# Patient Record
Sex: Female | Born: 1970 | Race: White | Hispanic: No | Marital: Married | Smoking: Never smoker
Health system: Southern US, Community
[De-identification: ages and names within clinical notes are randomized; demographics above are authoritative.]

## PROBLEM LIST (undated history)

## (undated) DIAGNOSIS — F329 Major depressive disorder, single episode, unspecified: Secondary | ICD-10-CM

## (undated) DIAGNOSIS — M545 Low back pain, unspecified: Secondary | ICD-10-CM

## (undated) DIAGNOSIS — N200 Calculus of kidney: Secondary | ICD-10-CM

## (undated) DIAGNOSIS — Z9989 Dependence on other enabling machines and devices: Secondary | ICD-10-CM

## (undated) DIAGNOSIS — G4733 Obstructive sleep apnea (adult) (pediatric): Secondary | ICD-10-CM

## (undated) DIAGNOSIS — F32A Depression, unspecified: Secondary | ICD-10-CM

## (undated) DIAGNOSIS — G8929 Other chronic pain: Secondary | ICD-10-CM

## (undated) DIAGNOSIS — E7800 Pure hypercholesterolemia, unspecified: Secondary | ICD-10-CM

## (undated) HISTORY — PX: INTRAUTERINE DEVICE INSERTION: SHX323

---

## 2012-08-13 ENCOUNTER — Other Ambulatory Visit: Payer: Self-pay | Admitting: Neurological Surgery

## 2012-08-28 ENCOUNTER — Encounter (HOSPITAL_COMMUNITY): Payer: Self-pay | Admitting: Pharmacy Technician

## 2012-09-03 ENCOUNTER — Encounter (HOSPITAL_COMMUNITY)
Admission: RE | Admit: 2012-09-03 | Discharge: 2012-09-03 | Disposition: A | Discharge status: Home / Self Care | Payer: 59 | Source: Ambulatory Visit | Attending: Neurological Surgery | Admitting: Neurological Surgery

## 2012-09-03 ENCOUNTER — Encounter (HOSPITAL_COMMUNITY): Payer: Self-pay

## 2012-09-03 HISTORY — DX: Major depressive disorder, single episode, unspecified: F32.9

## 2012-09-03 HISTORY — DX: Depression, unspecified: F32.A

## 2012-09-03 LAB — CBC
HCT: 43.7 % (ref 36.0–46.0)
MCH: 30.1 pg (ref 26.0–34.0)
MCV: 82.6 fL (ref 78.0–100.0)
Platelets: 237 10*3/uL (ref 150–400)
RBC: 5.29 MIL/uL — ABNORMAL HIGH (ref 3.87–5.11)
RDW: 12.5 % (ref 11.5–15.5)

## 2012-09-03 LAB — TYPE AND SCREEN: Antibody Screen: NEGATIVE

## 2012-09-03 LAB — BASIC METABOLIC PANEL
BUN: 10 mg/dL (ref 6–23)
CO2: 24 mEq/L (ref 19–32)
Calcium: 10 mg/dL (ref 8.4–10.5)
Chloride: 103 mEq/L (ref 96–112)
Creatinine, Ser: 0.55 mg/dL (ref 0.50–1.10)

## 2012-09-03 LAB — SURGICAL PCR SCREEN: MRSA, PCR: NEGATIVE

## 2012-09-03 NOTE — Pre-Procedure Instructions (Addendum)
Vila Dory Mckesson  09/03/2012   Your procedure is scheduled on:  09/10/12  Report to [facility] at 530 AM.  Call this number if you have problems the morning of surgery: [phone]   Remember:   Do not eat food or drink liquids after midnight.   Take these medicines the morning of surgery with A SIP OF WATER:pain med,   STOP ibuprofen  09/05/12   Do not wear jewelry, make-up or nail polish.  Do not wear lotions, powders, or perfumes. You may wear deodorant.  Do not shave 48 hours prior to surgery. Men may shave face and neck.  Do not bring valuables to the hospital.  Contacts, dentures or bridgework may not be worn into surgery.  Leave suitcase in the car. After surgery it may be brought to your room.  For patients admitted to the hospital, checkout time is 11:00 AM the day of  discharge.   Patients discharged the day of surgery will not be allowed to drive  home.  Name and phone number of your driver:   Special Instructions: Shower using CHG 2 nights before surgery and the night before surgery.  If you shower the day of surgery use CHG.  Use special wash - you have one bottle of CHG for all showers.  You should use approximately 1/3 of the bottle for each shower.   Please read over the following fact sheets that you were given: Pain Booklet, Coughing and Deep Breathing, Blood Transfusion Information, MRSA Information and Surgical Site Infection Prevention

## 2012-09-09 MED ORDER — CEFAZOLIN SODIUM-DEXTROSE 2-3 GM-% IV SOLR
2.0000 g | INTRAVENOUS | Status: AC
  Administered 2012-09-10: 2 g via INTRAVENOUS
  Filled 2012-09-09 (×2): qty 50

## 2012-09-10 ENCOUNTER — Encounter (HOSPITAL_COMMUNITY): Payer: Self-pay | Admitting: *Deleted

## 2012-09-10 ENCOUNTER — Inpatient Hospital Stay (HOSPITAL_COMMUNITY)
Admission: RE | Admit: 2012-09-10 | Discharge: 2012-09-12 | DRG: 460 | Disposition: A | Discharge status: Home / Self Care | Payer: 59 | Source: Ambulatory Visit | Attending: Neurological Surgery | Admitting: Neurological Surgery

## 2012-09-10 ENCOUNTER — Inpatient Hospital Stay (HOSPITAL_COMMUNITY): Payer: 59

## 2012-09-10 ENCOUNTER — Encounter (HOSPITAL_COMMUNITY): Payer: Self-pay | Admitting: Vascular Surgery

## 2012-09-10 ENCOUNTER — Encounter (HOSPITAL_COMMUNITY)
Admission: RE | Disposition: A | Discharge status: Home / Self Care | Payer: Self-pay | Source: Ambulatory Visit | Attending: Neurological Surgery

## 2012-09-10 ENCOUNTER — Inpatient Hospital Stay (HOSPITAL_COMMUNITY): Payer: 59 | Admitting: Certified Registered"

## 2012-09-10 DIAGNOSIS — R0602 Shortness of breath: Secondary | ICD-10-CM | POA: Diagnosis present

## 2012-09-10 DIAGNOSIS — H9319 Tinnitus, unspecified ear: Secondary | ICD-10-CM | POA: Diagnosis present

## 2012-09-10 DIAGNOSIS — M47817 Spondylosis without myelopathy or radiculopathy, lumbosacral region: Principal | ICD-10-CM | POA: Diagnosis present

## 2012-09-10 DIAGNOSIS — M7989 Other specified soft tissue disorders: Secondary | ICD-10-CM | POA: Diagnosis present

## 2012-09-10 DIAGNOSIS — E7800 Pure hypercholesterolemia, unspecified: Secondary | ICD-10-CM | POA: Diagnosis present

## 2012-09-10 DIAGNOSIS — F3289 Other specified depressive episodes: Secondary | ICD-10-CM | POA: Diagnosis present

## 2012-09-10 DIAGNOSIS — M129 Arthropathy, unspecified: Secondary | ICD-10-CM | POA: Diagnosis present

## 2012-09-10 DIAGNOSIS — F329 Major depressive disorder, single episode, unspecified: Secondary | ICD-10-CM | POA: Diagnosis present

## 2012-09-10 HISTORY — DX: Other chronic pain: G89.29

## 2012-09-10 HISTORY — DX: Dependence on other enabling machines and devices: Z99.89

## 2012-09-10 HISTORY — PX: ANTERIOR LUMBAR FUSION: SHX1170

## 2012-09-10 HISTORY — PX: ANTERIOR FUSION LUMBAR SPINE: SUR629

## 2012-09-10 HISTORY — DX: Calculus of kidney: N20.0

## 2012-09-10 HISTORY — DX: Obstructive sleep apnea (adult) (pediatric): G47.33

## 2012-09-10 HISTORY — DX: Pure hypercholesterolemia, unspecified: E78.00

## 2012-09-10 HISTORY — DX: Low back pain: M54.5

## 2012-09-10 HISTORY — DX: Low back pain, unspecified: M54.50

## 2012-09-10 LAB — CBC
HCT: 39.7 % (ref 36.0–46.0)
Hemoglobin: 14.1 g/dL (ref 12.0–15.0)
MCH: 29.7 pg (ref 26.0–34.0)
MCV: 83.8 fL (ref 78.0–100.0)
RBC: 4.74 MIL/uL (ref 3.87–5.11)

## 2012-09-10 SURGERY — ANTERIOR LUMBAR FUSION 1 LEVEL
Anesthesia: General | Site: Abdomen | Wound class: Clean

## 2012-09-10 MED ORDER — SODIUM CHLORIDE 0.9 % IV SOLN: INTRAVENOUS | Status: DC

## 2012-09-10 MED ORDER — OXYCODONE-ACETAMINOPHEN 5-325 MG PO TABS
1.0000 | ORAL_TABLET | ORAL | Status: DC | PRN
  Administered 2012-09-10 – 2012-09-11 (×3): 2 via ORAL
  Administered 2012-09-12 (×2): 1 via ORAL
  Filled 2012-09-10 (×3): qty 2
  Filled 2012-09-10: qty 1

## 2012-09-10 MED ORDER — BISACODYL 10 MG RE SUPP: 10.0000 mg | Freq: Every day | RECTAL | Status: DC | PRN

## 2012-09-10 MED ORDER — ACETAMINOPHEN 650 MG RE SUPP: 650.0000 mg | RECTAL | Status: DC | PRN

## 2012-09-10 MED ORDER — BACITRACIN 50000 UNITS IM SOLR
INTRAMUSCULAR | Status: AC
  Filled 2012-09-10: qty 1

## 2012-09-10 MED ORDER — LORATADINE 10 MG PO TABS
10.0000 mg | ORAL_TABLET | Freq: Every day | ORAL | Status: DC
  Administered 2012-09-10 – 2012-09-12 (×3): 10 mg via ORAL
  Filled 2012-09-10 (×3): qty 1

## 2012-09-10 MED ORDER — LIDOCAINE-EPINEPHRINE 1 %-1:100000 IJ SOLN
INTRAMUSCULAR | Status: DC | PRN
  Administered 2012-09-10: 5 mL

## 2012-09-10 MED ORDER — ACETAMINOPHEN 325 MG PO TABS: 650.0000 mg | ORAL_TABLET | ORAL | Status: DC | PRN

## 2012-09-10 MED ORDER — HYDROMORPHONE HCL PF 1 MG/ML IJ SOLN
INTRAMUSCULAR | Status: AC
  Filled 2012-09-10: qty 1

## 2012-09-10 MED ORDER — DEXAMETHASONE SODIUM PHOSPHATE 10 MG/ML IJ SOLN
INTRAMUSCULAR | Status: DC | PRN
  Administered 2012-09-10: 8 mg via INTRAVENOUS

## 2012-09-10 MED ORDER — DOCUSATE SODIUM 100 MG PO CAPS
100.0000 mg | ORAL_CAPSULE | Freq: Two times a day (BID) | ORAL | Status: DC
  Administered 2012-09-10 – 2012-09-12 (×5): 100 mg via ORAL
  Filled 2012-09-10 (×5): qty 1

## 2012-09-10 MED ORDER — ONDANSETRON HCL 4 MG/2ML IJ SOLN: 4.0000 mg | Freq: Once | INTRAMUSCULAR | Status: DC | PRN

## 2012-09-10 MED ORDER — LIDOCAINE HCL 4 % MT SOLN
OROMUCOSAL | Status: DC | PRN
  Administered 2012-09-10: 4 mL via TOPICAL

## 2012-09-10 MED ORDER — NEOSTIGMINE METHYLSULFATE 1 MG/ML IJ SOLN
INTRAMUSCULAR | Status: DC | PRN
  Administered 2012-09-10: 4 mg via INTRAVENOUS

## 2012-09-10 MED ORDER — FENTANYL CITRATE 0.05 MG/ML IJ SOLN
INTRAMUSCULAR | Status: DC | PRN
  Administered 2012-09-10: 100 ug via INTRAVENOUS
  Administered 2012-09-10: 50 ug via INTRAVENOUS
  Administered 2012-09-10: 100 ug via INTRAVENOUS
  Administered 2012-09-10: 50 ug via INTRAVENOUS

## 2012-09-10 MED ORDER — ONDANSETRON HCL 4 MG/2ML IJ SOLN
4.0000 mg | INTRAMUSCULAR | Status: DC | PRN
Start: 2012-09-10 — End: 2012-09-12

## 2012-09-10 MED ORDER — ONDANSETRON HCL 4 MG/2ML IJ SOLN
INTRAMUSCULAR | Status: DC | PRN
  Administered 2012-09-10: 4 mg via INTRAVENOUS

## 2012-09-10 MED ORDER — BUPIVACAINE HCL (PF) 0.25 % IJ SOLN
INTRAMUSCULAR | Status: DC | PRN
  Administered 2012-09-10: 5 mL

## 2012-09-10 MED ORDER — SODIUM CHLORIDE 0.9 % IV SOLN: 250.0000 mL | INTRAVENOUS | Status: DC

## 2012-09-10 MED ORDER — OXYCODONE-ACETAMINOPHEN 5-325 MG PO TABS
ORAL_TABLET | ORAL | Status: AC
  Filled 2012-09-10: qty 2

## 2012-09-10 MED ORDER — 0.9 % SODIUM CHLORIDE (POUR BTL) OPTIME
TOPICAL | Status: DC | PRN
  Administered 2012-09-10: 1000 mL

## 2012-09-10 MED ORDER — HYDROMORPHONE HCL PF 1 MG/ML IJ SOLN
INTRAMUSCULAR | Status: AC
  Administered 2012-09-10: 0.5 mg via INTRAVENOUS
  Filled 2012-09-10: qty 1

## 2012-09-10 MED ORDER — ROCURONIUM BROMIDE 100 MG/10ML IV SOLN
INTRAVENOUS | Status: DC | PRN
Start: 2012-09-10 — End: 2012-09-10
  Administered 2012-09-10 (×3): 10 mg via INTRAVENOUS
  Administered 2012-09-10: 50 mg via INTRAVENOUS
  Administered 2012-09-10: 20 mg via INTRAVENOUS

## 2012-09-10 MED ORDER — MENTHOL 3 MG MT LOZG: 1.0000 | LOZENGE | OROMUCOSAL | Status: DC | PRN

## 2012-09-10 MED ORDER — SENNA 8.6 MG PO TABS
1.0000 | ORAL_TABLET | Freq: Two times a day (BID) | ORAL | Status: DC
  Administered 2012-09-10 – 2012-09-12 (×5): 8.6 mg via ORAL
  Filled 2012-09-10 (×6): qty 1

## 2012-09-10 MED ORDER — ENOXAPARIN SODIUM 40 MG/0.4ML ~~LOC~~ SOLN
40.0000 mg | SUBCUTANEOUS | Status: DC
  Administered 2012-09-11 – 2012-09-12 (×2): 40 mg via SUBCUTANEOUS
  Filled 2012-09-10 (×3): qty 0.4

## 2012-09-10 MED ORDER — DESVENLAFAXINE SUCCINATE ER 50 MG PO TB24
50.0000 mg | ORAL_TABLET | Freq: Every day | ORAL | Status: DC
  Administered 2012-09-10 – 2012-09-11 (×2): 50 mg via ORAL
  Filled 2012-09-10 (×3): qty 1

## 2012-09-10 MED ORDER — FLEET ENEMA 7-19 GM/118ML RE ENEM: 1.0000 | ENEMA | Freq: Once | RECTAL | Status: AC | PRN

## 2012-09-10 MED ORDER — GLYCOPYRROLATE 0.2 MG/ML IJ SOLN
INTRAMUSCULAR | Status: DC | PRN
  Administered 2012-09-10: 0.6 mg via INTRAVENOUS

## 2012-09-10 MED ORDER — SODIUM CHLORIDE 0.9 % IJ SOLN: 3.0000 mL | INTRAMUSCULAR | Status: DC | PRN

## 2012-09-10 MED ORDER — THROMBIN 20000 UNITS EX SOLR
CUTANEOUS | Status: DC | PRN
  Administered 2012-09-10: 08:00:00 via TOPICAL

## 2012-09-10 MED ORDER — ACETAMINOPHEN 10 MG/ML IV SOLN
INTRAVENOUS | Status: AC
  Administered 2012-09-10: 1000 mg via INTRAVENOUS
  Filled 2012-09-10: qty 100

## 2012-09-10 MED ORDER — POLYETHYLENE GLYCOL 3350 17 G PO PACK
17.0000 g | PACK | Freq: Every day | ORAL | Status: DC | PRN
  Filled 2012-09-10: qty 1

## 2012-09-10 MED ORDER — SODIUM CHLORIDE 0.9 % IV SOLN
INTRAVENOUS | Status: AC
  Filled 2012-09-10: qty 500

## 2012-09-10 MED ORDER — PROPOFOL 10 MG/ML IV BOLUS
INTRAVENOUS | Status: DC | PRN
  Administered 2012-09-10: 200 mg via INTRAVENOUS

## 2012-09-10 MED ORDER — METHOCARBAMOL 100 MG/ML IJ SOLN
500.0000 mg | Freq: Four times a day (QID) | INTRAVENOUS | Status: DC | PRN
  Filled 2012-09-10: qty 5

## 2012-09-10 MED ORDER — CEFAZOLIN SODIUM 1-5 GM-% IV SOLN
1.0000 g | Freq: Three times a day (TID) | INTRAVENOUS | Status: AC
  Administered 2012-09-10 (×2): 1 g via INTRAVENOUS
  Filled 2012-09-10 (×2): qty 50

## 2012-09-10 MED ORDER — HYDROMORPHONE HCL PF 1 MG/ML IJ SOLN
0.2500 mg | INTRAMUSCULAR | Status: DC | PRN
  Administered 2012-09-10 (×2): 0.5 mg via INTRAVENOUS

## 2012-09-10 MED ORDER — LIDOCAINE HCL (CARDIAC) 20 MG/ML IV SOLN
INTRAVENOUS | Status: DC | PRN
  Administered 2012-09-10: 80 mg via INTRAVENOUS

## 2012-09-10 MED ORDER — PHENOL 1.4 % MT LIQD: 1.0000 | OROMUCOSAL | Status: DC | PRN

## 2012-09-10 MED ORDER — SODIUM CHLORIDE 0.9 % IR SOLN
Status: DC | PRN
  Administered 2012-09-10: 08:00:00

## 2012-09-10 MED ORDER — LACTATED RINGERS IV SOLN
INTRAVENOUS | Status: DC | PRN
  Administered 2012-09-10 (×2): via INTRAVENOUS

## 2012-09-10 MED ORDER — METHOCARBAMOL 500 MG PO TABS
500.0000 mg | ORAL_TABLET | Freq: Four times a day (QID) | ORAL | Status: DC | PRN
  Administered 2012-09-10: 500 mg via ORAL
  Filled 2012-09-10: qty 1

## 2012-09-10 MED ORDER — SODIUM CHLORIDE 0.9 % IJ SOLN
3.0000 mL | Freq: Two times a day (BID) | INTRAMUSCULAR | Status: DC
  Administered 2012-09-11 (×2): 3 mL via INTRAVENOUS

## 2012-09-10 MED ORDER — ALUM & MAG HYDROXIDE-SIMETH 200-200-20 MG/5ML PO SUSP: 30.0000 mL | Freq: Four times a day (QID) | ORAL | Status: DC | PRN

## 2012-09-10 MED ORDER — HYDROMORPHONE HCL PF 1 MG/ML IJ SOLN: 0.2500 mg | INTRAMUSCULAR | Status: DC | PRN

## 2012-09-10 MED ORDER — MORPHINE SULFATE 2 MG/ML IJ SOLN: 1.0000 mg | INTRAMUSCULAR | Status: DC | PRN

## 2012-09-10 SURGICAL SUPPLY — 75 items
APPLIER CLIP 11 MED OPEN (CLIP) ×2
BAG DECANTER FOR FLEXI CONT (MISCELLANEOUS) ×2 IMPLANT
BUR BARREL STRAIGHT FLUTE 4.0 (BURR) ×2 IMPLANT
BUR MATCHSTICK NEURO 3.0 LAGG (BURR) ×2 IMPLANT
CANISTER SUCTION 2500CC (MISCELLANEOUS) ×2 IMPLANT
CLIP APPLIE 11 MED OPEN (CLIP) ×1 IMPLANT
CLOTH BEACON ORANGE TIMEOUT ST (SAFETY) ×2 IMPLANT
CONT SPEC 4OZ CLIKSEAL STRL BL (MISCELLANEOUS) ×2 IMPLANT
CORDS BIPOLAR (ELECTRODE) IMPLANT
COVER BACK TABLE 24X17X13 BIG (DRAPES) IMPLANT
COVER TABLE BACK 60X90 (DRAPES) ×2 IMPLANT
DECANTER SPIKE VIAL GLASS SM (MISCELLANEOUS) ×2 IMPLANT
DERMABOND ADHESIVE PROPEN (GAUZE/BANDAGES/DRESSINGS) ×1
DERMABOND ADVANCED (GAUZE/BANDAGES/DRESSINGS)
DERMABOND ADVANCED .7 DNX12 (GAUZE/BANDAGES/DRESSINGS) IMPLANT
DERMABOND ADVANCED .7 DNX6 (GAUZE/BANDAGES/DRESSINGS) ×1 IMPLANT
DRAPE C-ARM 42X72 X-RAY (DRAPES) ×6 IMPLANT
DRAPE LAPAROTOMY 100X72X124 (DRAPES) ×2 IMPLANT
DRAPE POUCH INSTRU U-SHP 10X18 (DRAPES) ×2 IMPLANT
DURAPREP 26ML APPLICATOR (WOUND CARE) ×2 IMPLANT
ELECT BLADE 4.0 EZ CLEAN MEGAD (MISCELLANEOUS) ×2
ELECT REM PT RETURN 9FT ADLT (ELECTROSURGICAL) ×2
ELECTRODE BLDE 4.0 EZ CLN MEGD (MISCELLANEOUS) ×1 IMPLANT
ELECTRODE REM PT RTRN 9FT ADLT (ELECTROSURGICAL) ×1 IMPLANT
GAUZE SPONGE 4X4 16PLY XRAY LF (GAUZE/BANDAGES/DRESSINGS) IMPLANT
GLOVE BIO SURGEON STRL SZ7.5 (GLOVE) IMPLANT
GLOVE BIOGEL PI IND STRL 7.5 (GLOVE) ×1 IMPLANT
GLOVE BIOGEL PI IND STRL 8 (GLOVE) ×2 IMPLANT
GLOVE BIOGEL PI IND STRL 8.5 (GLOVE) ×2 IMPLANT
GLOVE BIOGEL PI INDICATOR 7.5 (GLOVE) ×1
GLOVE BIOGEL PI INDICATOR 8 (GLOVE) ×2
GLOVE BIOGEL PI INDICATOR 8.5 (GLOVE) ×2
GLOVE ECLIPSE 8.5 STRL (GLOVE) IMPLANT
GLOVE EXAM NITRILE LRG STRL (GLOVE) IMPLANT
GLOVE EXAM NITRILE MD LF STRL (GLOVE) IMPLANT
GLOVE EXAM NITRILE XL STR (GLOVE) IMPLANT
GLOVE EXAM NITRILE XS STR PU (GLOVE) IMPLANT
GLOVE OPTIFIT SS 7.5 STRL LX (GLOVE) IMPLANT
GLOVE SS N UNI LF 7.5 STRL (GLOVE) IMPLANT
GLOVE SURG SS PI 7.5 STRL IVOR (GLOVE) ×10 IMPLANT
GLOVE SURG SS PI 8.5 STRL IVOR (GLOVE) ×3
GLOVE SURG SS PI 8.5 STRL STRW (GLOVE) ×3 IMPLANT
GOWN BRE IMP SLV AUR LG STRL (GOWN DISPOSABLE) IMPLANT
GOWN BRE IMP SLV AUR XL STRL (GOWN DISPOSABLE) ×6 IMPLANT
GOWN STRL REIN 2XL LVL4 (GOWN DISPOSABLE) ×4 IMPLANT
KIT BASIN OR (CUSTOM PROCEDURE TRAY) ×2 IMPLANT
KIT ROOM TURNOVER OR (KITS) ×2 IMPLANT
NEEDLE HYPO 25X1 1.5 SAFETY (NEEDLE) ×2 IMPLANT
NEEDLE SPNL 18GX3.5 QUINCKE PK (NEEDLE) ×2 IMPLANT
NS IRRIG 1000ML POUR BTL (IV SOLUTION) ×2 IMPLANT
PACK LAMINECTOMY NEURO (CUSTOM PROCEDURE TRAY) ×2 IMPLANT
PUTTY BONE DBX 5CC MIX (Putty) ×2 IMPLANT
SCREW 20MM (Screw) ×8 IMPLANT
SPONGE INTESTINAL PEANUT (DISPOSABLE) ×4 IMPLANT
SPONGE LAP 18X18 X RAY DECT (DISPOSABLE) ×2 IMPLANT
SPONGE SURGIFOAM ABS GEL 100 (HEMOSTASIS) ×2 IMPLANT
SUT PDS AB 5-0 P3 18 (SUTURE) ×4 IMPLANT
SUT SILK 2 0 TIES 10X30 (SUTURE) ×2 IMPLANT
SUT VIC AB 0 CT1 27 (SUTURE) ×1
SUT VIC AB 0 CT1 27XBRD ANBCTR (SUTURE) ×1 IMPLANT
SUT VIC AB 1 CT1 18XBRD ANBCTR (SUTURE) ×2 IMPLANT
SUT VIC AB 1 CT1 8-18 (SUTURE) ×2
SUT VIC AB 2-0 CP2 18 (SUTURE) ×4 IMPLANT
SUT VIC AB 3-0 FS2 27 (SUTURE) ×2 IMPLANT
SUT VIC AB 3-0 SH 8-18 (SUTURE) ×4 IMPLANT
SUT VICRYL 4-0 PS2 18IN ABS (SUTURE) IMPLANT
SYNFIX LR 26MMX32X12MM (Orthopedic Implant) ×2 IMPLANT
SYR 20ML ECCENTRIC (SYRINGE) ×2 IMPLANT
SYR CONTROL 10ML LL (SYRINGE) ×2 IMPLANT
TOWEL OR 17X24 6PK STRL BLUE (TOWEL DISPOSABLE) ×2 IMPLANT
TOWEL OR 17X26 10 PK STRL BLUE (TOWEL DISPOSABLE) ×2 IMPLANT
TRAP SPECIMEN MUCOUS 40CC (MISCELLANEOUS) ×2 IMPLANT
TRAY FOLEY BAG SILVER LF 14FR (CATHETERS) ×2 IMPLANT
TRAY FOLEY CATH 14FRSI W/METER (CATHETERS) IMPLANT
WATER STERILE IRR 1000ML POUR (IV SOLUTION) ×2 IMPLANT

## 2012-09-10 NOTE — Preoperative (Signed)
Beta Blockers   Reason not to administer Beta Blockers:Not Applicable 

## 2012-09-10 NOTE — H&P (Signed)
CHIEF COMPLAINT:   Degenerating discs in the lower back, leg and hip pain.  HISTORY OF PRESENT ILLNESS:  Robin Deleon is a 42 year old right-handed individual who works in Public relations account executive.  She has had difficulty with her back for at least 5 years period of time since 2008.  She notes she has some degenerated and herniating discs.  In 2011, she underwent an MRI of the lumbar spine.  The study was reviewed in the office today and it demonstrates that she has some degenerative changes in L4-5 and L5-S1 with some lateral recess stenosis at L5-S1. She tells me she has been treated with a number of Cortisone injections by Dr. Antonietta Barcelona and she has had bilateral transforaminal blocks at L5-S1 on several occasions.  She describes that her pain radiates into both hips, sometimes worse on the right than on the left and visa versa.  She gets little in the way of radicular pain but she does note she has had some of this also.  The main problem is centralized low back pain.  She finds that standing is the singular worse position for her.  Sitting tends to relieve the discomfort mostly but then sitting too long can only be relieved by lying down.    PAST MEDICAL HISTORY:  Her general health has been good.  FAMILY HISTORY:    There is a significant family history of diabetes, high blood pressure and neurological diseases and some mental illness.  Her mother is age 55 in poor health and has a number of medical problems noted.  Father is age 95 in good health.    ALLERGIES:    SHE HAS AN ALLERGY TO DOXYCYCLINE WHICH CAUSES VOMITING AND TRAMADOL SHE DOES NOT TOLERATE.  SOCIAL HISTORY:    She does not smoke. She drinks alcohol socially.  She notes that her weight has increased and currently she is 5'8" tall and 215 pounds.    REVIEW OF SYSTEMS:   Notable for ringing in the ears, nasal congestion and drainage, high cholesterol, swelling in the feet and hands, leg pain while walking, shortness of breath, leg  pain, joint pain and swelling, arthritis, neck pain, back pain, in addition to depression.    CURRENT MEDICATIONS:  Pristiq 50 mg. q.d., Motrin 800 mg. p.r.n., Vicodin 7.5/325 p.r.n. and Zyrtec 20 mg. q.d.    PHYSICAL EXAMINATION:  On physical examination, I note that she stands straight and erect.  She can flex forward +70 degrees.  She can toe and heel walk without difficulty.  Tone and bulk are good in the major groups in the lower extremities.  DTR's are 2+ in the patellae and Achilles both.  Straight leg raising is negative to 80 degrees.  Patrick's maneuver is negative bilaterally.    She finds that lying with her legs straight out seems to exacerbate the pain in her low back.    IMPRESSION:    On the basis of her studies from 2011, I note that there is significant degenerative change particularly at L5-S1 and I would agree that transforaminal injections should have given her some substantial relief.  Nonetheless, her last injection only gave her very minimal relief if any at all.  A new MRI of her lumbar spine was performed recently.  The study demonstrates that she has advancement of the degenerative process at L5-S1 with significant Modic changes in the end-plates which I demonstrated to her via the edema that was present there.  In addition, I note that she has broad-based  protrusion of the disc into the extraforaminal zone causing more compression on the L5 nerve roots greater on the right than the left.  Milanna walks in today demonstrating she is experiencing a foot drop greater on the right side than on the left.  This is completely consistent with the radiographic findings.    I discussed the findings with her and I had previously outlined that I believe Avani would be a good candidate for stabilization of the L5-S1 disc space via a total diskectomy to decompress the nerve roots and an anterior lumbar interbody arthrodesis to stabilize the joints.  I noted in order to access the  extraforaminal zones on the right and left would require a sacrifice to the facet joints going posteriorly.  Her facet joints at L5-S1 appear to be fairly healthy and stable.  The disc itself is markedly degenerated.    Taylie did have good relief with an intradiscal injection previously but this was only for a few months time.  This process of her back pain has been going on for a number of years.  She has had physical therapy on two separate occasions and she has been using and performing a home exercise program quite regularly on her own.  She had responded to epidural steroid injections prior to the intradiscal injections and she did have good temporary relief with the intradiscal injections but at this point she is experiencing a foot drop that appears to be worsening on the right side and with worsening disc degeneration. I believe that ultimately surgical intervention via an anterior interbody decompression and arthrodesis would be her best option for recovery from this process.    I discussed the surgery including the risks to the great vessels, the abdominal contents which is approached via a retroperitoneal approach.  I indicated a typical hospital stay would be about 3-4 days.  The things that tend to keep patients in the hospital are difficulties with constipation or ileus.  Beyond that, she would require an external corset when she is first mobilized for about the first six weeks postoperatively.  Thereafter she can mobilize without an external corset.  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  Largely she is confined to the desk but she is able to work from the home place.  I noted that it would be upwards of 12 weeks before she would be released to work without any particular restriction but we could negotiate her return to work from the home place earlier than that.  At this time, she is quite miserable with the pain and the  weakness is quite bothersome also.  She is admitted for an anterior decompression and interbody arthrodesis with Peek spacers.

## 2012-09-10 NOTE — Anesthesia Preprocedure Evaluation (Addendum)
Anesthesia Evaluation  Patient identified by MRN, date of birth, ID band Patient awake    Reviewed: Allergy & Precautions, H&P , NPO status , Patient's Chart, lab work & pertinent test results, reviewed documented beta blocker date and time   Airway Mallampati: I TM Distance: >3 FB Neck ROM: full    Dental  (+) Teeth Intact and Dental Advisory Given   Pulmonary sleep apnea and Continuous Positive Airway Pressure Ventilation ,  breath sounds clear to auscultation        Cardiovascular negative cardio ROS  Rhythm:regular Rate:Normal     Neuro/Psych    GI/Hepatic negative GI ROS, Neg liver ROS,   Endo/Other  negative endocrine ROS  Renal/GU negative Renal ROS     Musculoskeletal negative musculoskeletal ROS (+)   Abdominal (+)  Abdomen: soft. Bowel sounds: normal.  Peds  Hematology negative hematology ROS (+)   Anesthesia Other Findings   Reproductive/Obstetrics negative OB ROS                         Anesthesia Physical Anesthesia Plan  ASA: II  Anesthesia Plan: General   Post-op Pain Management:    Induction: Intravenous  Airway Management Planned: Oral ETT  Additional Equipment:   Intra-op Plan:   Post-operative Plan: Extubation in OR  Informed Consent: I have reviewed the patients History and Physical, chart, labs and discussed the procedure including the risks, benefits and alternatives for the proposed anesthesia with the patient or authorized representative who has indicated his/her understanding and acceptance.     Plan Discussed with: CRNA, Anesthesiologist and Surgeon  Anesthesia Plan Comments:         Anesthesia Quick Evaluation

## 2012-09-10 NOTE — Anesthesia Postprocedure Evaluation (Signed)
  Anesthesia Post-op Note  Patient: Robin Deleon  Procedure(s) Performed: Procedure(s) with comments: LUMBAR FIVE-SACRAL ONE ANTERIOR LUMBAR INTERBODY FUSION  (N/A) - Lumbar five-sacral one anterior lumbar interbody fusion  Patient Location: PACU  Anesthesia Type:General  Level of Consciousness: awake, alert , oriented and patient cooperative  Airway and Oxygen Therapy: Patient Spontanous Breathing  Post-op Pain: mild  Post-op Assessment: Post-op Vital signs reviewed, Patient's Cardiovascular Status Stable, Respiratory Function Stable, Patent Airway, No signs of Nausea or vomiting and Pain level controlled  Post-op Vital Signs: stable  Complications: No apparent anesthesia complications

## 2012-09-10 NOTE — Op Note (Signed)
Preoperative diagnosis: L5-S1 spondylosis with degeneration of the disc and left-sided extraforaminal protrusion causing left L5 radiculopathy Postoperative diagnosis: L5-S1 spondylosis with degeneration of the disc and left-sided extraforaminal disc herniation causing left L5 radiculopathy. Procedure: L5-S1 anterior lumbar interbody arthrodesis using peek spacer , Synthes anterior intrinsic plate. Date of surgery: 09/10/2012 Surgeon: Barnett Abu Asst.: Lelon Perla Anesthesia: Gen. endotracheal Indications: Patient is a 42 year old individual is had significant back and left lower extremity pain primarily since 2008 she is failed all manner of conservative management including extensive physical therapy a number of epidural steroid injections. She did receive transient relief with one intradiscal injection at L5-S1 however this was short-lived lasting approximately 2 months after careful consideration of her options we discussed fusion of L5-S1 it is felt that we'll be best approached via an anterior approach as she has a foraminal protrusion of the disc in the extraforaminal space at L5-S1 on the left causing some L5 radiculopathy no significant facet arthropathy she is now being taken to the operating room to undergo anterior decompression arthrodesis at L5-S1.  Procedure: The patient was brought to the operating room supine on the stretcher she was placed on the table in supine position and general endotracheal anesthesia was induced. Fluoroscopic guidance was used to localize the L5-S1 on the anterior abdominal wall a left transverse incision was created in the line drawn corresponding to the disc space at L5-S1. The skin was prepped with alcohol and DuraPrep and the hair was clipped on the anterior abdominal wall. A transverse incision was then created after infusing 6 cc of lidocaine with epinephrine subcutaneously. The dissection was taken down through Scarpa's fascia to identify the anterior rectus  sheath. The rectus sheath was opened from side to side. The lateral border of the rectus was then dissected medially and the posterior rectus sheath was identified. Inferiorly was identified a linear alba and the end of the posterior rectus sheath was noted. By dissecting bluntly in this region is able to separate the peritoneum from the posterior rectus sheath and dissected into the retroperitoneal cavity. The lateral inferior border of the posterior rectus sheath was then opened superiorly to allow better placement of retractor and then to dissect in the retroperitoneal space. Care was taken to mobilize structures medially and laterally until the prevertebral space was reached. Ear some fat in retroperitoneal lymph nodes were encountered these were dissected free and the L5-S1 space was identified by dissecting in the midline was able to identify 2 sizable presacral veins were individually ligated and then divided. Further dissection yielded the anterior border of the L5-S1 disc space. When this was secured a retractor with Brau blades was placed to secure the anterior border of L5-S1. Some malleable retractors were then placed to achieve retraction in the superior-inferior plane. The L5-S1 disc space was then opened with a #15 blade and cocci 12 is reviews to dissect the degenerated disc from the endplates at L5 and S1. The disc space was noted to be markedly degenerated and the disc material was severely degenerated and desiccated there is collapse of the disc space worse on the left side than on the right. A series of dilators were then placed into the disc space the open and maintain the height of the disc space. There was noted to be substantial subligamentous protrusion of disc material which was clean the ligament was not opened on the right side there was a small extraforaminal disc protrusion in the subligamentous space and this was dissected and removed. On  the left side significant osteophytes were  shaved down and the subligamentous space was also explored of some bony overgrowth and some small amount of disc material. In the end by restoring disc height are able to place a 12 mm tall 12 trial into the interspace. This was then visualized fluoroscopically and is felt that the small footprint measuring 2 4 mm in depth with fit best. This was then filled with demineralized bone matrix the endplates were prepared and secured by carefully decorticating all the tissues this was done in concert with Dr. Karene Fry will help to perform the lateral dissections as he stood across the table for me. When the spacer was ready we inserted under direct fluoroscopic vision using a squid inserter. Fluoroscopic imaging of the placement of the device was obtained and then 450 mm locking screws were placed into the intrinsic plate ventrally at L5-S1. Final radiographs were then obtained in the AP and lateral projection. Some remnants of demineralized bone matrix were packed into the interspace. Carefully then the retractors were removed sequentially making sure that there was good hemostasis and the retroperitoneal space when this was verified the wound was inspected and anterior rectus sheath was closed with a running #1 Vicryl in. 2-0 Vicryl was used in the subcutaneous tissues 3-0 Vicryl was used as a running deep subcuticular and 5-0 PDS was used as a superficial subcuticular layer Dermabond was placed on the skin. Patient tolerated the procedure was returned to recovery room in stable condition blood loss was estimated at less than 100 cc.

## 2012-09-10 NOTE — Anesthesia Procedure Notes (Addendum)
Procedure Name: Intubation Date/Time: 09/10/2012 7:30 AM Performed by: Ellin Goodie Pre-anesthesia Checklist: Patient identified, Emergency Drugs available, Suction available, Patient being monitored and Timeout performed Patient Re-evaluated:Patient Re-evaluated prior to inductionOxygen Delivery Method: Circle system utilized Intubation Type: IV induction Ventilation: Mask ventilation without difficulty Laryngoscope Size: Mac and 4 Grade View: Grade I Tube type: Oral Tube size: 7.5 mm Number of attempts: 1 Airway Equipment and Method: Stylet Placement Confirmation: ETT inserted through vocal cords under direct vision,  positive ETCO2 and breath sounds checked- equal and bilateral Secured at: 22 cm Tube secured with: Tape Dental Injury: Teeth and Oropharynx as per pre-operative assessment     Performed by: Ellin Goodie Airway Equipment and Method: LTA kit utilized

## 2012-09-10 NOTE — Progress Notes (Signed)
Orthopedic Tech Progress Note Patient Details:  Robin Deleon August 18, 1970 [id number] Brace order completed by bio[employer] vendor Lauro Franklin.. Patient ID: [name], female   DOB: June 02, 1970, 42 y.o.   MRN: [id number]   Robin Deleon 09/10/2012, 9:30 PM

## 2012-09-10 NOTE — Progress Notes (Signed)
Patient ID: Robin Deleon, female   DOB: 1970-11-02, 42 y.o.   MRN: [id number] Vital signs stable normal lower extremity function. Some right leg soreness. Dressing dry and intact.

## 2012-09-10 NOTE — Transfer of Care (Signed)
Immediate Anesthesia Transfer of Care Note  Patient: Robin Deleon  Procedure(s) Performed: Procedure(s) with comments: LUMBAR FIVE-SACRAL ONE ANTERIOR LUMBAR INTERBODY FUSION  (N/A) - Lumbar five-sacral one anterior lumbar interbody fusion  Patient Location: PACU  Anesthesia Type:General  Level of Consciousness: awake, alert  and oriented  Airway & Oxygen Therapy: Patient Spontanous Breathing and Patient connected to face mask  Post-op Assessment: Report given to PACU RN  Post vital signs: stable  Complications: No apparent anesthesia complications

## 2012-09-10 NOTE — Progress Notes (Signed)
Orthopedic Tech Progress Note Patient Details:  Robin Deleon [facility] [birth year] [id number] Called [employer] for brace. Patient ID: Robin Deleon, female   DOB: [birth year], 42 y.o.   MRN: [id number]   Robin Deleon 09/10/2012, 6:47 PM

## 2012-09-10 NOTE — OR Nursing (Signed)
No foreign bodies reported after xray by Dr. Allyson Sabal radiologist

## 2012-09-11 ENCOUNTER — Encounter (HOSPITAL_COMMUNITY): Payer: Self-pay | Admitting: Neurological Surgery

## 2012-09-11 MED FILL — Heparin Sodium (Porcine) Inj 1000 Unit/ML: INTRAMUSCULAR | Qty: 30 | Status: AC

## 2012-09-11 MED FILL — Sodium Chloride IV Soln 0.9%: INTRAVENOUS | Qty: 1000 | Status: AC

## 2012-09-11 NOTE — Progress Notes (Signed)
Subjective: Patient reports Feels quite well leg pain is much better today. Back is minimally sore. Abdomen with appropriate soreness.  Objective: Vital signs in last 24 hours: Temp:  [97.7 F (36.5 C)-98.2 F (36.8 C)] 97.8 F (36.6 C) (04/15 1436) Pulse Rate:  [69-106] 69 (04/15 1436) Resp:  [18-20] 18 (04/15 1436) BP: (109-117)/(47-67) 115/67 mmHg (04/15 1436) SpO2:  [93 %-99 %] 99 % (04/15 1436)  Intake/Output from previous day: 04/14 0701 - 04/15 0700 In: 1700 [I.V.:1700] Out: 1600 [Urine:1550; Blood:50] Intake/Output this shift:    Incision is clean dry motor function is intact in lower extremities  Lab Results:  Recent Labs  09/10/12 1518  WBC 17.8*  HGB 14.1  HCT 39.7  PLT 221   BMET  Recent Labs  09/10/12 1518  CREATININE 0.57    Studies/Results: Dg Lumbar Spine 2-3 Views  09/10/2012  *RADIOLOGY REPORT*  Clinical data:  Lumbar fusion  LUMBAR SPINE TWO-VIEW, INTRAOPERATIVE FLUOROSCOPY  Comparison:  MR 08/02/2012  Findings:  Two spot images from intraoperative C-arm fluoroscopy document changes of anterior instrumented fusion L5-S1. Hardware appears appropriately positioned.  Normal alignment.  IMPRESSION: Anterior fusion L5-S1   Original Report Authenticated By: D. Andria Rhein, MD    Dg C-arm 1-60 Min  09/10/2012  *RADIOLOGY REPORT*  Clinical data:  Lumbar fusion  LUMBAR SPINE TWO-VIEW, INTRAOPERATIVE FLUOROSCOPY  Comparison:  MR 08/02/2012  Findings:  Two spot images from intraoperative C-arm fluoroscopy document changes of anterior instrumented fusion L5-S1. Hardware appears appropriately positioned.  Normal alignment.  IMPRESSION: Anterior fusion L5-S1   Original Report Authenticated By: D. Andria Rhein, MD    Dg Or Local Abdomen  09/10/2012  *RADIOLOGY REPORT*  Clinical Data: Instrument count  OR LOCAL ABDOMEN  Comparison: None.  Findings: A portable supine film of the pelvis from the operating room shows hardware for anterior fusion at L5-S1.  An IUD is  present in the midline.  No other opaque foreign body is seen.  IMPRESSION: Post anterior fusion at L5-S1.  IUD.  No other opaque foreign body.  This report was called to the operating room at the time of interpretation.   Original Report Authenticated By: Dwyane Dee, M.D.     Assessment/Plan: Stable postop day 1  LOS: 1 day  Continue to mobilize as tolerated.   Jakorey Mcconathy J 09/11/2012, 7:27 PM

## 2012-09-11 NOTE — Clinical Social Work Note (Signed)
Clinical Social Work   CSW received consult for SNF. CSW reviewed chart and discussed pt with RN during progression. Awaiting PT/OT evals for discharge recommendations. CSW will assess for SNF, if appropriate. CSW will continue to follow.   Wali Reinheimer, MSW, LCSW #[phone] 

## 2012-09-11 NOTE — Progress Notes (Signed)
Occupational Therapy Evaluation Patient Details Name: Robin Deleon MRN: [id number] DOB: [birth year] Today's Date: 09/11/2012 Time: 1036-1100 OT Time Calculation (min): 24 min  OT Assessment / Plan / Recommendation Clinical Impression  42 yo s/p ant decompression/interbody arthrodesis. PTA, pt independent with all ADL and mobility. works Community education officer. Husband is available PRN to assist. Pt making excellent progress. Completed all education regarding ADL, DME and AE. Pt overall Mod I with mobility without AD and ADL after education. Pt ready to D/C home tomorrow if medically stable. No further OT indicated.     OT Assessment  Patient does not need any further OT services    Follow Up Recommendations  No OT follow up    Barriers to Discharge      Equipment Recommendations  None recommended by OT    Recommendations for Other Services    Frequency       Precautions / Restrictions Precautions Precautions: Back Precaution Booklet Issued: Yes (comment) Required Braces or Orthoses: Spinal Brace Spinal Brace: Lumbar corset   Pertinent Vitals/Pain no apparent distress     ADL  Grooming: Modified independent Where Assessed - Grooming: Unsupported standing Upper Body Bathing: Supervision/safety Where Assessed - Upper Body Bathing: Unsupported sitting Lower Body Bathing: Supervision/safety Where Assessed - Lower Body Bathing: Unsupported sit to stand Upper Body Dressing: Supervision/safety Where Assessed - Upper Body Dressing: Unsupported standing Lower Body Dressing: Supervision/safety Where Assessed - Lower Body Dressing: Unsupported sit to stand Toilet Transfer: Modified independent Toilet Transfer Method: Sit to Barista: Comfort height toilet Toileting - Clothing Manipulation and Hygiene: Supervision/safety Where Assessed - Engineer, mining and Hygiene: Sit to stand from 3-in-1 or toilet Tub/Shower Transfer: Supervision/safety Tub/Shower  Transfer Method: Science writer: Shower seat with back Equipment Used: Back brace;Gait belt Transfers/Ambulation Related to ADLs: mod I ADL Comments: Educated on back precautions for ADL. Given handout, Eduated on available AE. Rec for pt to use built in showerseat for bathing.     OT Diagnosis:    OT Problem List:   OT Treatment Interventions:     OT Goals Acute Rehab OT Goals OT Goal Formulation:  (eval only)  Visit Information  Last OT Received On: 09/11/12 Assistance Needed: +1    Subjective Data      Prior Functioning     Home Living Lives With: Spouse Available Help at Discharge: Available PRN/intermittently Type of Home: House Home Access: Stairs to enter [employer] of Steps: 4 Entrance Stairs-Rails: Left Home Layout: Two level;Bed/bath upstairs;1/2 bath on main level Alternate Level Stairs-Number of Steps: flight Alternate Level Stairs-Rails: Left Bathroom Shower/Tub: Walk-in shower;Door [employer] Toilet: Standard Home Adaptive Equipment: Reacher;Walker - rolling Prior Function Level of Independence: Independent Able to Take Stairs?: Yes Driving: Yes 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 Communication Communication: No difficulties Dominant Hand: Right         Vision/Perception Vision - History Baseline Vision: No visual deficits   Cognition  Cognition Overall Cognitive Status: Appears within functional limits for tasks assessed/performed Arousal/Alertness: Awake/alert Orientation Level: Appears intact for tasks assessed Behavior During Session: [facility] for tasks performed    Extremity/Trunk Assessment Right Upper Extremity Assessment RUE ROM/Strength/Tone: [facility] for tasks assessed Left Upper Extremity Assessment LUE ROM/Strength/Tone: WFL for tasks assessed Right Lower Extremity Assessment RLE ROM/Strength/Tone: [facility] for tasks assessed Left Lower Extremity Assessment LLE ROM/Strength/Tone: [facility] for  tasks assessed Trunk Assessment Trunk Assessment: Normal     Mobility Bed Mobility Bed Mobility: Supine to Sit Supine to Sit: 6:  Modified independent (Device/Increase time);HOB flat Details for Bed Mobility Assistance: Demonstratinggood back precautions Transfers Transfers: Sit to Stand;Stand to Sit Sit to Stand: From bed;6: Modified independent (Device/Increase time) Stand to Sit: 6: Modified independent (Device/Increase time)     Exercise     Balance  Good   End of Session OT - End of Session Equipment Utilized During Treatment: Gait belt;Back brace Activity Tolerance: Patient tolerated treatment well Patient left: in chair;with call bell/phone within reach Nurse Communication: Mobility status  GO     Brady Schiller,HILLARY 09/11/2012, 11:10 AM Luisa Dago, OTR/L  [phone] 09/11/2012

## 2012-09-11 NOTE — Evaluation (Signed)
Physical Therapy Evaluation Patient Details Name: Robin Deleon MRN: [id number] DOB: [birth year] Today's Date: 09/11/2012 Time: 0865-7846 PT Time Calculation (min): 24 min  PT Assessment / Plan / Recommendation Clinical Impression  Pt s/p L5-S1 fusion. Pt moving very well and able to perform gait modified independent, stairs with supervision. Recommend pt have supervision when performing steps at home, will follow up with PT tomorrow if pt does not D/C earlier to continue to practice steps. Pt able to verbalize and adhere to back precautions well throughout session. Discussed safety with pets and assorted furniture (avoiding rolling chairs, recliners). Pt appears strong enough for D/C when medically ready, will benefit from outpatient PT once she is close to returning to work.    PT Assessment  Patient needs continued PT services    Follow Up Recommendations  No PT follow up       Barriers to Discharge None      Equipment Recommendations  None recommended by PT       Frequency Min 4X/week    Precautions / Restrictions Precautions Precautions: Back Precaution Booklet Issued: Yes (comment) Required Braces or Orthoses: Spinal Brace Spinal Brace: Lumbar corset   Pertinent Vitals/Pain 1/10 back pain      Mobility  Bed Mobility Bed Mobility: Supine to Sit Supine to Sit: 6: Modified independent (Device/Increase time);HOB flat Details for Bed Mobility Assistance: Demonstratinggood back precautions Transfers Transfers: Sit to Stand;Stand to Sit Sit to Stand: 6: Modified independent (Device/Increase time) Stand to Sit: 6: Modified independent (Device/Increase time) Ambulation/Gait Ambulation/Gait Assistance: 5: Supervision;6: Modified independent (Device/Increase time) Ambulation Distance (Feet): 300 Feet (x 2) Assistive device: None Ambulation/Gait Assistance Details: Great mechanics and posture, supervision progressing to modified independent Gait Pattern:  (Slight  decreased dorsiflexion of Rt. ankle, chronic/improved) Stairs: Yes Stairs Assistance: 5: Supervision Stairs Assistance Details (indicate cue type and reason): Cues for safety, speed. No overt losses of balance however recommended to pt to have supervision at this time when at home and performing steps Stair Management Technique: One rail Left Number of Stairs: 12        PT Diagnosis: Generalized weakness  PT Problem List:  (needs supervision with step) PT Treatment Interventions: Gait training;Stair training;Functional mobility training;Therapeutic activities;Therapeutic exercise;Balance training;Patient/family education   PT Goals Acute Rehab PT Goals PT Goal Formulation: With patient Pt will Go Up / Down Stairs: Flight;with modified independence PT Goal: Up/Down Stairs - Progress: Goal set today Additional Goals Additional Goal #1: Pt will demonstrate adherence to all back precautions during session without verbal cues PT Goal: Additional Goal #1 - Progress: Goal set today  Visit Information  Assistance Needed: +1    Subjective Data  Subjective: "I am a little sore but ready to get out of here" Patient Stated Goal: Go home   Prior Functioning  Home Living Lives With: Spouse Available Help at Discharge: Available PRN/intermittently Type of Home: House Home Access: Stairs to enter [employer] of Steps: 4 Entrance Stairs-Rails: Left Home Layout: Two level;Bed/bath upstairs;1/2 bath on main level Alternate Level Stairs-Number of Steps: flight Alternate Level Stairs-Rails: Left Bathroom Shower/Tub: Walk-in shower;Door [employer] Toilet: Standard Home Adaptive Equipment: Reacher;Walker - rolling Prior Function Level of Independence: Independent Able to Take Stairs?: Yes Driving: Yes Vocation: Full time employment Comments: [employer] job Communication Communication: No difficulties Dominant Hand: Right    Cognition  Cognition Overall Cognitive Status: Appears  within functional limits for tasks assessed/performed Arousal/Alertness: Awake/alert Orientation Level: Appears intact for tasks assessed Behavior During Session: [facility] for tasks performed  Extremity/Trunk Assessment Right Upper Extremity Assessment RUE ROM/Strength/Tone: WFL for tasks assessed Left Upper Extremity Assessment LUE ROM/Strength/Tone: WFL for tasks assessed Right Lower Extremity Assessment RLE ROM/Strength/Tone: WFL for tasks assessed RLE Sensation: WFL - Light Touch RLE Coordination: WFL - gross/fine motor Left Lower Extremity Assessment LLE ROM/Strength/Tone: WFL for tasks assessed LLE Sensation: WFL - Light Touch LLE Coordination: WFL - gross/fine motor Trunk Assessment Trunk Assessment: Normal   Balance High Level Balance High Level Balance Activites: Turns;Sudden stops;Head turns High Level Balance Comments: Supervision for high level balance tasks  End of Session PT - End of Session Equipment Utilized During Treatment: Gait belt;Back brace Activity Tolerance: Patient tolerated treatment well Patient left: in chair  GP     Wilhemina Bonito 09/11/2012, 12:35 PM

## 2012-09-11 NOTE — Clinical Social Work Note (Signed)
Clinical Social Work   CSW reviewed chart. PT/OT are not recommending any follow up and will be safe to discharge home. CSW is signing off as no further needs identified. Please reconsult if a need arises prior to discharge.   Dede Query, MSW, LCSW [phone]

## 2012-09-12 MED ORDER — METHOCARBAMOL 500 MG PO TABS: 500.0000 mg | ORAL_TABLET | Freq: Four times a day (QID) | ORAL | Status: DC | PRN

## 2012-09-12 MED ORDER — OXYCODONE-ACETAMINOPHEN 5-325 MG PO TABS: 1.0000 | ORAL_TABLET | ORAL | Status: DC | PRN

## 2012-09-12 NOTE — Progress Notes (Signed)
Pt seen and found asleep already wearing cpap with her nasal mask from home.  Pt appears to be tolerating well at this time.  RN aware.

## 2012-09-12 NOTE — Discharge Summary (Signed)
Physician Discharge Summary  Patient ID: Robin Deleon MRN: [id number] DOB/AGE: [birth year] 42 y.o.  Admit date: 09/10/2012 Discharge date: 09/12/2012  Admission Diagnoses: Lumbar spondylosis L5-S1 with radiculopathy  Discharge Diagnoses: Lumbar spondylosis L5-S1 with radiculopathy Active Problems:   * No active hospital problems. *   Discharged Condition: good  Hospital Course: Patient was admitted to undergo anterior lumbar decompression arthrodesis using Synthes spacer device. She tolerated surgery well. Is a good relief of her preoperative pain  Consults: None  Significant Diagnostic Studies: None  Treatments: surgery: Anterior lumbar decompression arthrodesis L5-S1  Discharge Exam: Blood pressure 112/63, pulse 93, temperature 98 F (36.7 C), temperature source Oral, resp. rate 18, height 5\' 8"  (1.727 m), weight 98.1 kg (216 lb 4.3 oz), last menstrual period 03/02/2012, SpO2 98.00%. Incision is clean and dry motor function is intact in lower extremities station and gait is normal  Disposition: Discharge home Discharge Orders   Future Orders Complete By Expires     Call MD for:  redness, tenderness, or signs of infection (pain, swelling, redness, odor or green/yellow discharge around incision site)  As directed     Call MD for:  severe uncontrolled pain  As directed     Call MD for:  temperature >100.4  As directed     Diet - low sodium heart healthy  As directed     Discharge instructions  As directed     Comments:      Okay to shower. Do not apply salves or appointments to incision. No heavy lifting with the upper extremities greater than 15 pounds. May resume driving when not requiring pain medication and patient feels comfortable with doing so.    Increase activity slowly  As directed         Medication List    TAKE these medications       amoxicillin-clavulanate 500-125 MG per tablet  Commonly known as:  AUGMENTIN  Take 1 tablet by mouth 2 (two) times daily.      cetirizine 10 MG chewable tablet  Commonly known as:  ZYRTEC  Chew 10 mg by mouth 2 (two) times daily.     desvenlafaxine 50 MG 24 hr tablet  Commonly known as:  PRISTIQ  Take 50 mg by mouth daily.     HYDROcodone-acetaminophen 7.5-500 MG per tablet  Commonly known as:  LORTAB  Take 1 tablet by mouth every 6 (six) hours as needed for pain.     ibuprofen 800 MG tablet  Commonly known as:  ADVIL,MOTRIN  Take 800 mg by mouth every 8 (eight) hours as needed for pain. For pain     methocarbamol 500 MG tablet  Commonly known as:  ROBAXIN  Take 1 tablet (500 mg total) by mouth every 6 (six) hours as needed.     oxyCODONE-acetaminophen 5-325 MG per tablet  Commonly known as:  PERCOCET/ROXICET  Take 1-2 tablets by mouth every 4 (four) hours as needed for pain.     oxyCODONE-acetaminophen 7.5-325 MG per tablet  Commonly known as:  PERCOCET  Take 1 tablet by mouth every 4 (four) hours as needed for pain.     Vitamin D3 3000 UNITS Tabs  Take 3,000 Units by mouth daily.         SignedStefani Dama 09/12/2012, 9:42 AM

## 2012-09-12 NOTE — Progress Notes (Signed)
Physical Therapy Treatment Patient Details Name: Robin Deleon MRN: [id number] DOB: [birth year] Today's Date: 09/12/2012 Time: 4098-1191 PT Time Calculation (min): 25 min  PT Assessment / Plan / Recommendation Comments on Treatment Session  Pt is 42 yo female s/p anterior lumbar fusion who is mod I with all mobility and shows good grasp of precautions and postural considerations for maintaining a healthy back. She is experiencing less pain now (5/10) than she was before surgery, has met therapy goals and is being discharged from PT at this time. Follow-up with OP as appropriate for return to work.    Follow Up Recommendations  No PT follow up;Other (comment) (OP when appropriate)     Does the patient have the potential to tolerate intense rehabilitation     Barriers to Discharge        Equipment Recommendations  None recommended by PT    Recommendations for Other Services    Frequency Min 4X/week   Plan All goals met and education completed, patient dischaged from PT services    Precautions / Restrictions Precautions Precautions: Back Precaution Comments: pt verbalized 3/3 back precautions and kept with mobility Required Braces or Orthoses: Spinal Brace Spinal Brace: Lumbar corset Restrictions Weight Bearing Restrictions: No   Pertinent Vitals/Pain 5/10 pain, premedicated    Mobility  Bed Mobility Bed Mobility: Supine to Sit Supine to Sit: 7: Independent;HOB flat Details for Bed Mobility Assistance: kept precautions, rolled first, no need for rail Transfers Transfers: Sit to Stand;Stand to Sit Sit to Stand: 7: Independent;From chair/3-in-1;From bed Stand to Sit: 7: Independent;To bed;To chair/3-in-1 Details for Transfer Assistance: pt transferring safely from various heights, discussed helpfulness of comfort height toilet that she is hoping to have installed in her master bath Ambulation/Gait Ambulation/Gait Assistance: 6: Modified independent (Device/Increase  time) Ambulation Distance (Feet): 400 Feet Assistive device: None Ambulation/Gait Assistance Details: Discussed abdominal activation with activity, specifically ambulation. Pt would good grasp of postural concepts as well as body awareness Gait Pattern: Within Functional Limits Gait velocity: WFL Stairs: Yes Stairs Assistance: 6: Modified independent (Device/Increase time) Stairs Assistance Details (indicate cue type and reason): full flight with no difficulty, HR used for safety, alternating pattern with no right leg discomort Stair Management Technique: One rail Left;Forwards;Alternating pattern Number of Stairs: 12 Wheelchair Mobility Wheelchair Mobility: No    Exercises     PT Diagnosis:    PT Problem List:   PT Treatment Interventions:     PT Goals Acute Rehab PT Goals PT Goal Formulation: With patient Time For Goal Achievement: 09/18/12 Potential to Achieve Goals: Good Pt will Go Up / Down Stairs: Flight;with modified independence PT Goal: Up/Down Stairs - Progress: Met Additional Goals Additional Goal #1: Pt will demonstrate adherence to all back precautions during session without verbal cues PT Goal: Additional Goal #1 - Progress: Met  Visit Information  Last PT Received On: 09/12/12 Assistance Needed: +1    Subjective Data  Subjective: I hope I can go home today Patient Stated Goal: Go home   Cognition  Cognition Arousal/Alertness: Awake/alert Behavior During Therapy: WFL for tasks assessed/performed Overall Cognitive Status: Within Functional Limits for tasks assessed    Balance  Balance Balance Assessed: Yes Dynamic Standing Balance Dynamic Standing - Balance Support: No upper extremity supported;During functional activity Dynamic Standing - Level of Assistance: 6: Modified independent (Device/Increase time) High Level Balance High Level Balance Activites: Turns;Sudden stops High Level Balance Comments: mod I  End of Session PT - End of  Session Equipment Utilized During Treatment:  Back brace Activity Tolerance: Patient tolerated treatment well Patient left: in bed;with call bell/phone within reach   GP   Lyanne Co, PT  Acute Rehab Services  [phone]   Lyanne Co 09/12/2012, 9:54 AM

## 2012-09-12 NOTE — Progress Notes (Signed)
Pt given discharge instructions and RX.  No questions or concerns voiced at this time, waiting for ride.

## 2012-09-12 NOTE — Progress Notes (Signed)
Pt. Given discharge instructions, and RX.  No questions or concerns.

## 2012-09-12 NOTE — Care Management Note (Signed)
    Page 1 of 1   09/12/2012     1:12:15 PM   CARE MANAGEMENT NOTE 09/12/2012  Patient:  Robin Deleon, Robin Deleon   Account Number:  [id number]  Date Initiated:  09/10/2012  Documentation initiated by:  [facility]  Subjective/Objective Assessment:   admitted postop L5-S1 ALIF     Action/Plan:   PT/OT evals- no follow up recommended   Anticipated DC Date:  09/13/2012   Anticipated DC Plan:  HOME/SELF CARE      DC Planning Services  CM consult      Choice offered to / List presented to:             Status of service:  Completed, signed off Medicare Important Message given?   (If response is "NO", the following Medicare IM given date fields will be blank) Date Medicare IM given:   Date Additional Medicare IM given:    Discharge Disposition:  HOME/SELF CARE  Per UR Regulation:  Reviewed for med. necessity/level of care/duration of stay  If discussed at Long Length of Stay Meetings, dates discussed:    Comments:

## 2013-08-16 ENCOUNTER — Ambulatory Visit (INDEPENDENT_AMBULATORY_CARE_PROVIDER_SITE_OTHER): Payer: 59 | Admitting: Family Medicine

## 2013-08-16 ENCOUNTER — Encounter: Payer: Self-pay | Admitting: Family Medicine

## 2013-08-16 VITALS — BP 113/71 | HR 76 | Ht 68.0 in | Wt 221.0 lb

## 2013-08-16 DIAGNOSIS — R7309 Other abnormal glucose: Secondary | ICD-10-CM

## 2013-08-16 DIAGNOSIS — R7303 Prediabetes: Secondary | ICD-10-CM

## 2013-08-16 DIAGNOSIS — F3289 Other specified depressive episodes: Secondary | ICD-10-CM

## 2013-08-16 DIAGNOSIS — R5383 Other fatigue: Secondary | ICD-10-CM | POA: Insufficient documentation

## 2013-08-16 DIAGNOSIS — L989 Disorder of the skin and subcutaneous tissue, unspecified: Secondary | ICD-10-CM

## 2013-08-16 DIAGNOSIS — R5381 Other malaise: Secondary | ICD-10-CM

## 2013-08-16 DIAGNOSIS — G44209 Tension-type headache, unspecified, not intractable: Secondary | ICD-10-CM | POA: Insufficient documentation

## 2013-08-16 DIAGNOSIS — F32A Depression, unspecified: Secondary | ICD-10-CM | POA: Insufficient documentation

## 2013-08-16 DIAGNOSIS — F329 Major depressive disorder, single episode, unspecified: Secondary | ICD-10-CM

## 2013-08-16 DIAGNOSIS — G4733 Obstructive sleep apnea (adult) (pediatric): Secondary | ICD-10-CM

## 2013-08-16 DIAGNOSIS — E785 Hyperlipidemia, unspecified: Secondary | ICD-10-CM | POA: Insufficient documentation

## 2013-08-16 MED ORDER — DESVENLAFAXINE SUCCINATE ER 50 MG PO TB24: 50.0000 mg | ORAL_TABLET | Freq: Every day | ORAL | Status: DC

## 2013-08-16 NOTE — Progress Notes (Signed)
CC: Robin Deleon is a 43 y.o. female is here for Establish Care   Subjective: HPI:  Very pleasant 43 year old here to establish care  Patient reports a history of depression stemming back approximately 10 years ago. For the past 5 years she has been on pristiq and is quite satisfied with lack of depression or any other mental disturbance. She is requesting refills today and denies any recent or remote thoughts of harm self or others.  Reports that when off of this medication she needs almost no sleep dysfunction. She denies any other manic-like activity recently or remotely.  Patient reports a history of obstructive sleep apnea currently on auto Pap using a nightly basis.  She reports a history of prediabetes has never had an A1c has never had a fasting blood sugar of 120  History of hyperlipidemia it's been greater than one year since lipids were checked last she's never been on cholesterol lowering medication  Her major complaint today is fatigue that has been present for matter of months that is present on a daily basis. Described as no energy moderate to severe in severity. She denies falling asleep unintentionally but reports she can take a nap and fall asleep at will anytime of the day. Denies falling asleep behind the wheel. She describes restorative sleep but major fatigue throughout the day. Occurs both on work days and on days off. She has a history of vitamin D and B12 deficiency that even on supplementation did not improve her fatigue in the past.  She mentions that she has some discoloration on her shins and on her buttocks that come and go throughout the year nothing particularly makes better or worse she has tried prednisone, Diflucan without any benefit to these lesions they're painless and do not itch  Review of Systems - General ROS: negative for - chills, fever, night sweats, weight gain or weight loss Ophthalmic ROS: negative for - decreased vision Psychological ROS:  negative for - anxiety or uncontrolled depression ENT ROS: negative for - hearing change, nasal congestion, tinnitus or allergies Hematological and Lymphatic ROS: negative for - bleeding problems, bruising or swollen lymph nodes Breast ROS: negative Respiratory ROS: no cough, shortness of breath, or wheezing Cardiovascular ROS: no chest pain or dyspnea on exertion Gastrointestinal ROS: no abdominal pain, change in bowel habits, or black or bloody stools Genito-Urinary ROS: negative for - genital discharge, genital ulcers, incontinence or abnormal bleeding from genitals Musculoskeletal ROS: negative for - joint pain or muscle pain Neurological ROS: negative for - headaches or memory loss Dermatological ROS: negative for lumps, mole changes, rash and skin lesion changes other than that described above  Past Medical History  Diagnosis Date  . Depression   . High cholesterol   . OSA on CPAP     "been using mask probably 3 yr" (09/10/2012)  . Chronic lower back pain   . Kidney stones     "never treated; just passed them" (09/10/2012)    Past Surgical History  Procedure Laterality Date  . Anterior fusion lumbar spine  09/10/2012    "L5-S1" (09/10/2012)  . Intrauterine device insertion  ~ 06/2012    "that's my 3rd" (09/10/2012)  . Anterior lumbar fusion N/A 09/10/2012    Procedure: LUMBAR FIVE-SACRAL ONE ANTERIOR LUMBAR INTERBODY FUSION ;  Surgeon: Barnett Abu, MD;  Location: [facility];  Service: Neurosurgery;  Laterality: N/A;  Lumbar five-sacral one anterior lumbar interbody fusion   Family History  Problem Relation Age of Onset  .  Heart disease Mother   . Diabetes Mother   . Hyperlipidemia Mother   . Thyroid disease Mother   . Cancer Sister   . Thyroid disease Sister   . Heart disease Brother   . Bipolar disorder Brother   . Thyroid disease Brother   . Heart disease Maternal Grandfather     History   Social History  . Marital Status: Married    Spouse Name: N/A    Number of  Children: N/A  . Years of Education: N/A   Occupational History  . Not on file.   Social History Main Topics  . Smoking status: Never Smoker   . Smokeless tobacco: Never Used  . Alcohol Use: 0.0 oz/week    0 Glasses of wine, 0 Shots of liquor per week     Comment: 09/10/2012 "less than 1 shot or glass of wine/week"  . Drug Use: No  . Sexual Activity: Yes    Birth Control/ Protection: IUD   Other Topics Concern  . Not on file   Social History Narrative  . No narrative on file     Objective: BP 113/71  Pulse 76  Ht 5\' 8"  (1.727 m)  Wt 221 lb (100.245 kg)  BMI 33.61 kg/m2  General: Alert and Oriented, No Acute Distress HEENT: Pupils equal, round, reactive to light. Conjunctivae clear.  Moist membranes pharynx unremarkable Lungs: Clear to auscultation bilaterally, no wheezing/ronchi/rales.  Comfortable work of breathing. Good air movement. Cardiac: Regular rate and rhythm. Normal S1/S2.  No murmurs, rubs, nor gallops.   Abdomen: Obese soft Extremities: No peripheral edema.  Strong peripheral pulses.  Mental Status: No depression, anxiety, nor agitation. Skin: Warm and dry. Erythematous patches approximately 1 cm in diameter with slight overlying scale, 3 on right shin one on the back of the left calf  Assessment & Plan: Robin Deleon was seen today for establish care.  Diagnoses and associated orders for this visit:  Fatigue - Vitamin B12 - TSH - Vitamin D (25 hydroxy) - Ferritin - T4, free - T3, free - Hemoglobin  Depression  Obstructive sleep apnea  Prediabetes - Hemoglobin A1c  Hyperlipidemia - Lipid panel  Skin lesion  Other Orders - Discontinue: desvenlafaxine (PRISTIQ) 50 MG 24 hr tablet; Take 1 tablet (50 mg total) by mouth daily. - Discontinue: desvenlafaxine (PRISTIQ) 50 MG 24 hr tablet; Take 1 tablet (50 mg total) by mouth daily. - desvenlafaxine (PRISTIQ) 50 MG 24 hr tablet; Take 1 tablet (50 mg total) by mouth daily.    Fatigue: Looking into  labs above, she has a strong family history of hypothyroidism therefore checking free T4 and T3. Pristiq certainly could be causing fatigue however joint decision to check for the above the for adjusting her depression medication Depression: Controlled continue current regimen Objective sleep apnea: Controlled and unlikely causing fatigue due to AutoPap Prediabetes: Due for A1c Hyperlipidemia: Due for annual lipid panel Skin lesion on the legs: Advised that I would be more than happy to biopsy this at any time, she'll think about it but does not want it done today  Return if symptoms worsen or fail to improve.

## 2013-08-17 LAB — VITAMIN D 25 HYDROXY (VIT D DEFICIENCY, FRACTURES): VIT D 25 HYDROXY: 33 ng/mL (ref 30–89)

## 2013-08-17 LAB — T3, FREE: T3 FREE: 2.7 pg/mL (ref 2.3–4.2)

## 2013-08-17 LAB — TSH: TSH: 2.151 u[IU]/mL (ref 0.350–4.500)

## 2013-08-17 LAB — HEMOGLOBIN: HEMOGLOBIN: 14.4 g/dL (ref 12.0–15.0)

## 2013-08-17 LAB — VITAMIN B12: Vitamin B-12: 396 pg/mL (ref 211–911)

## 2013-08-17 LAB — T4, FREE: Free T4: 0.86 ng/dL (ref 0.80–1.80)

## 2013-08-17 LAB — FERRITIN: Ferritin: 171 ng/mL (ref 10–291)

## 2013-08-19 ENCOUNTER — Encounter: Payer: Self-pay | Admitting: Family Medicine

## 2013-08-29 ENCOUNTER — Encounter: Payer: Self-pay | Admitting: Family Medicine

## 2013-08-29 DIAGNOSIS — M069 Rheumatoid arthritis, unspecified: Secondary | ICD-10-CM | POA: Insufficient documentation

## 2013-09-11 LAB — LIPID PANEL
CHOLESTEROL: 213 mg/dL — AB (ref 0–200)
HDL: 39 mg/dL — AB (ref >39)
LDL Cholesterol: 149 mg/dL — ABNORMAL HIGH (ref 0–99)
TRIGLYCERIDES: 125 mg/dL (ref <150)
Total CHOL/HDL Ratio: 5.5 Ratio
VLDL: 25 mg/dL (ref 0–40)

## 2013-09-11 LAB — HEMOGLOBIN A1C
HEMOGLOBIN A1C: 6.2 % — AB (ref <5.7)
Mean Plasma Glucose: 131 mg/dL — ABNORMAL HIGH (ref <117)

## 2013-09-12 ENCOUNTER — Encounter: Payer: Self-pay | Admitting: Family Medicine

## 2014-01-02 ENCOUNTER — Emergency Department
Admission: EM | Admit: 2014-01-02 | Discharge: 2014-01-02 | Disposition: A | Discharge status: Home / Self Care | Payer: 59 | Source: Home / Self Care | Attending: Emergency Medicine | Admitting: Emergency Medicine

## 2014-01-02 ENCOUNTER — Encounter: Payer: Self-pay | Admitting: Emergency Medicine

## 2014-01-02 DIAGNOSIS — G44209 Tension-type headache, unspecified, not intractable: Secondary | ICD-10-CM

## 2014-01-02 DIAGNOSIS — G43909 Migraine, unspecified, not intractable, without status migrainosus: Secondary | ICD-10-CM

## 2014-01-02 MED ORDER — PROMETHAZINE HCL 25 MG/ML IJ SOLN
25.0000 mg | Freq: Once | INTRAMUSCULAR | Status: AC
  Administered 2014-01-02: 25 mg via INTRAMUSCULAR

## 2014-01-02 MED ORDER — KETOROLAC TROMETHAMINE 60 MG/2ML IM SOLN
60.0000 mg | Freq: Once | INTRAMUSCULAR | Status: AC
  Administered 2014-01-02: 60 mg via INTRAMUSCULAR

## 2014-01-02 NOTE — ED Provider Notes (Signed)
CSN: [id number]     Arrival date & time 01/02/14  1211 History   First MD Initiated Contact with Patient 01/02/14 1220     Chief Complaint  Patient presents with  . Headache    Patient is a 43 y.o. female presenting with headaches. The history is provided by the patient.  Headache Pain location:  Occipital (L) Quality:  Sharp Radiates to:  L neck Severity currently:  7/10 Associated symptoms: diarrhea, nausea (mild) and photophobia   Associated symptoms: no abdominal pain, no blurred vision, no cough, no dizziness, no drainage, no facial pain, no fever, no focal weakness, no paresthesias, no seizures, no sore throat, no syncope, no URI, no visual change, no vomiting and no weakness    States has history of tension headaches;She  denies diagnosis of 'migraine'; Current headache (sharp, 7/10), has lasted 5 days and there is focal point behind left ear that is also tender to touch. Has taken Vicodin and ibuprofen today. Helped somewhat. Headache is worsened by bright light.  has had treatments at chiropractors past 2 days, without significant relief.  States is also nauseated and has recently had diarrhea, 2 loose watery stools in the past 24 hours. No blood or mucus. Denies abdominal pain. Tolerating by mouth well. No fever or chills  Denies focal neurologic symptoms. No syncope. No vision change No urinary symptoms. Denies chance of pregnancy. Has IUD.  Past Medical History  Diagnosis Date  . Depression   . High cholesterol   . OSA on CPAP     "been using mask probably 3 yr" (09/10/2012)  . Chronic lower back pain   . Kidney stones     "never treated; just passed them" (09/10/2012)   Past Surgical History  Procedure Laterality Date  . Anterior fusion lumbar spine  09/10/2012    "L5-S1" (09/10/2012)  . Intrauterine device insertion  ~ 06/2012    "that's my 3rd" (09/10/2012)  . Anterior lumbar fusion N/A 09/10/2012    Procedure: LUMBAR FIVE-SACRAL ONE ANTERIOR LUMBAR INTERBODY FUSION  ;  Surgeon: Barnett Abu, MD;  Location: [facility];  Service: Neurosurgery;  Laterality: N/A;  Lumbar five-sacral one anterior lumbar interbody fusion   Family History  Problem Relation Age of Onset  . Heart disease Mother   . Diabetes Mother   . Hyperlipidemia Mother   . Thyroid disease Mother   . Cancer Sister   . Thyroid disease Sister   . Heart disease Brother   . Bipolar disorder Brother   . Thyroid disease Brother   . Heart disease Maternal Grandfather    History  Substance Use Topics  . Smoking status: Never Smoker   . Smokeless tobacco: Never Used  . Alcohol Use: 0.0 oz/week    0 Glasses of wine, 0 Shots of liquor per week     Comment: 09/10/2012 "less than 1 shot or glass of wine/week"   OB History   Grav Para Term Preterm Abortions TAB SAB Ect Mult Living                 Review of Systems  Constitutional: Negative for fever.  HENT: Negative for postnasal drip and sore throat.   Eyes: Positive for photophobia. Negative for blurred vision.  Respiratory: Negative for cough.   Cardiovascular: Negative for syncope.  Gastrointestinal: Positive for nausea (mild) and diarrhea. Negative for vomiting and abdominal pain.  Neurological: Positive for headaches. Negative for dizziness, focal weakness, seizures and paresthesias.  All other systems reviewed and are negative.  Allergies  Doxycycline; Latex; and Tape  Home Medications   Prior to Admission medications   Medication Sig Start Date End Date Taking? Authorizing Provider  cetirizine (ZYRTEC) 10 MG chewable tablet Chew 10 mg by mouth 2 (two) times daily.    Historical Provider, MD  desvenlafaxine (PRISTIQ) 50 MG 24 hr tablet Take 1 tablet (50 mg total) by mouth daily. 08/16/13   Sean Hommel, DO  ibuprofen (ADVIL,MOTRIN) 800 MG tablet Take 800 mg by mouth every 8 (eight) hours as needed for pain. For pain    Historical Provider, MD   BP 122/78  Pulse 87  Temp(Src) 98.2 F (36.8 C) (Oral)  Resp 16  Ht 5\' 8"   (1.727 m)  Wt 218 lb (98.884 kg)  BMI 33.15 kg/m2  SpO2 95% Physical Exam  Constitutional: She is oriented to person, place, and time. Vital signs are normal. She appears well-developed and well-nourished. She appears distressed (Mildly uncomfortable from headache).  Photophobic  HENT:  Head: Normocephalic and atraumatic. Head is without contusion.  Right Ear: Hearing, tympanic membrane and ear canal normal.  Left Ear: Hearing, tympanic membrane and ear canal normal.  Nose: Nose normal.  Mouth/Throat: Oropharynx is clear and moist and mucous membranes are normal.  No cranial tenderness or deformity  Eyes: EOM are normal. Pupils are equal, round, and reactive to light.  Neck: Neck supple. No spinous process tenderness present. No Brudzinski's sign and no Kernig's sign noted.  Tender, spasm left posterior occipital and posterior cervical muscles  Cardiovascular: Normal rate and regular rhythm.   Pulmonary/Chest: Effort normal and breath sounds normal. No respiratory distress.  Abdominal: Soft. She exhibits no distension. There is no tenderness.  Neurological: She is alert and oriented to person, place, and time. She has normal strength and normal reflexes. No cranial nerve deficit or sensory deficit. Gait normal. GCS eye subscore is 4. GCS verbal subscore is 5. GCS motor subscore is 6.  Skin: No rash noted.  Psychiatric: She has a normal mood and affect. Her speech is normal and behavior is normal. Thought content normal.   Left and right lateral bending of neck exacerbates the pain. ED Course  Procedures (including critical care time) Labs Review Labs Reviewed - No data to display  Imaging Review No results found.   MDM   1. Migraine, unspecified, without mention of intractable migraine without mention of status migrainosus   2. 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  Treatment options discussed, as well as risks, benefits,  alternatives. Patient voiced understanding and agreement with the following plans:  Toradol 60 mg and promethazine 25 mg IM stat.  Patient was observed and after 20 minutes, headache and nausea significantly improved.  Other treatment options discussed. She declined prescription for pain meds or muscle relaxant. May take Tylenol or ibuprofen at home but GI precautions from ibuprofen discussed. She has a few Vicodin at home to use when necessary, but she understands this is not for chronic use.  Follow-up with your primary care doctor in 5 days if not improving, or sooner if symptoms become worse. Precautions discussed. Red flags discussed.--ER if red flags. I mentioned the option of seeing a neurologist or headache specialist if headaches recur. Questions invited and answered. Patient voiced understanding and agreement.      , MD 01/05/14 1352

## 2014-01-02 NOTE — ED Notes (Addendum)
States has history of tension headaches; denies diagnosis of 'migraine'; this has lasted 5 days and there is focal point behind left ear that is also tender to touch. Has taken Vicodin and ibuprofen today; has had treatments at chiropractors past 2 days. States is also nauseated and has recently had diarrhea.

## 2014-01-08 ENCOUNTER — Ambulatory Visit (INDEPENDENT_AMBULATORY_CARE_PROVIDER_SITE_OTHER): Payer: 59 | Admitting: Family Medicine

## 2014-01-08 ENCOUNTER — Encounter: Payer: Self-pay | Admitting: Family Medicine

## 2014-01-08 VITALS — BP 114/77 | HR 77 | Wt 220.0 lb

## 2014-01-08 DIAGNOSIS — R635 Abnormal weight gain: Secondary | ICD-10-CM

## 2014-01-08 DIAGNOSIS — R51 Headache: Secondary | ICD-10-CM

## 2014-01-08 DIAGNOSIS — R42 Dizziness and giddiness: Secondary | ICD-10-CM

## 2014-01-08 MED ORDER — IBUPROFEN 800 MG PO TABS: 800.0000 mg | ORAL_TABLET | Freq: Three times a day (TID) | ORAL | Status: AC | PRN

## 2014-01-08 MED ORDER — BUTALBITAL-ASPIRIN-CAFFEINE 50-325-40 MG PO CAPS: 1.0000 | ORAL_CAPSULE | Freq: Every day | ORAL | Status: DC | PRN

## 2014-01-08 NOTE — Progress Notes (Signed)
CC: Robin Deleon is a 43 y.o. female is here for f/u Headaches   Subjective: HPI:  Patient complains of abnormal waking and as in present for the past years. Moderate in severity and present on monthly basis. She tells me that despite dieting, exercising most days of the week, and using her APAP machine she's never been able lose weight and continues to gain weight. She tells me symptoms were present when I saw her back in the spring when a TSH was normal, sounds like a basic metabolic panel was also included a workup with her OB/GYN which was normal. She wants to know if she should be seen by a specialist.  Complains of dizziness that has been present for matter of months. Fluctuates between mild to moderate in severity. Never occurs with changes in vertical positions. Is not reproducible but it does follow a pattern where it will occur when she rotates over in bed. Described as a drunken feeling and that the room around her spinning, lasts seconds. 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 She denies any unilateral tinnitus, hearing loss, nor ear pain/fullness or roaring in the ear. No other motor or sensory disturbances other than hot flashes and headaches below.  Complaints of chronic headaches going on for years described as a pain and tightness that begins in her occiput and radiates up to the crown of her head. Symptoms have been well controlled in the past with as needed Fiorinal until she can see a chiropractor and after adjustment symptoms will be absent for about a year. Her last prescription was given for her in 2010 and has lasted her up until now. She tells me she would take it less than once a week. She's run out of the medication and is wondering what to do. Denies any change in the character severity of her headaches. Occasionally with nausea, and no photophobia nor aura.  Review Of Systems  Outlined In HPI  Past Medical History  Diagnosis Date  . Depression   . High cholesterol   . OSA on CPAP     "been using mask probably 3 yr" (09/10/2012)  . Chronic lower back pain   . Kidney stones     "never treated; just passed them" (09/10/2012)    Past Surgical History  Procedure Laterality Date  . Anterior fusion lumbar spine  09/10/2012    "L5-S1" (09/10/2012)  . Intrauterine device insertion  ~ 06/2012    "that's my 3rd" (09/10/2012)  . Anterior lumbar fusion N/A 09/10/2012    Procedure: LUMBAR FIVE-SACRAL ONE ANTERIOR LUMBAR INTERBODY FUSION ;  Surgeon: Barnett Abu, MD;  Location: [facility];  Service: Neurosurgery;  Laterality: N/A;  Lumbar five-sacral one anterior lumbar interbody fusion   Family History  Problem Relation Age of Onset  . Heart disease Mother   . Diabetes Mother   . Hyperlipidemia Mother   . Thyroid disease Mother   . Cancer Sister   . Thyroid disease Sister   . Heart disease Brother   . Bipolar disorder Brother   . Thyroid disease Brother   . Heart disease Maternal Grandfather     History   Social History  . Marital Status: Married    Spouse Name: N/A    Number of Children: N/A  . Years of Education: N/A   Occupational History  . Not on file.   Social History Main Topics  .  Smoking status: Never Smoker   . Smokeless tobacco: Never Used  . Alcohol Use: 0.0 oz/week    0 Glasses of wine, 0 Shots of liquor per week     Comment: 09/10/2012 "less than 1 shot or glass of wine/week"  . Drug Use: No  . Sexual Activity: Yes    Birth Control/ Protection: IUD   Other Topics Concern  . Not on file   Social History Narrative  . No narrative on file     Objective: BP 114/77  Pulse 77  Wt 220 lb (99.791 kg)  Vital signs reviewed. General: Alert and Oriented, No Acute Distress HEENT: Pupils equal, round, reactive to light. Conjunctivae clear.  External ears unremarkable.  Moist mucous membranes. Lungs: Clear and comfortable work of  breathing, speaking in full sentences without accessory muscle use. Cardiac: Regular rate and rhythm.  Neuro: CN II-XII grossly intact, gait normal. Extremities: No peripheral edema.  Strong peripheral pulses.  Mental Status: No depression, anxiety, nor agitation. Logical though process. Skin: Warm and dry.  Assessment & Plan: Coda was seen today for f/u headaches.  Diagnoses and associated orders for this visit:  Abnormal weight gain - Ambulatory referral to Endocrinology  Dizziness - Ambulatory referral to Physical Therapy  Headache(784.0) - butalbital-aspirin-caffeine (FIORINAL) 50-325-40 MG per capsule; Take 1 capsule by mouth daily as needed for headache. - ibuprofen (ADVIL,MOTRIN) 800 MG tablet; Take 1 tablet (800 mg total) by mouth every 8 (eight) hours as needed. For pain    Abnormal weight gain: Referral to endocrinology for further workup Dizziness: Ambulatory referral to physical therapy specifically for vestibular rehabilitation Headache: Attempt ibuprofen prior to Fiorinal, discussed rebound phenomena of both of these medications and if she's taking them more than 2 times a week have asked her to return for preventative medication.  Return if symptoms worsen or fail to improve.

## 2014-01-30 ENCOUNTER — Ambulatory Visit: Payer: 59 | Admitting: Physical Therapy

## 2014-05-27 ENCOUNTER — Encounter: Payer: Self-pay | Admitting: Family Medicine

## 2014-05-27 ENCOUNTER — Ambulatory Visit (INDEPENDENT_AMBULATORY_CARE_PROVIDER_SITE_OTHER): Payer: 59 | Admitting: Family Medicine

## 2014-05-27 VITALS — BP 132/80 | HR 78 | Wt 224.0 lb

## 2014-05-27 DIAGNOSIS — L259 Unspecified contact dermatitis, unspecified cause: Secondary | ICD-10-CM

## 2014-05-27 MED ORDER — METHYLPREDNISOLONE SODIUM SUCC 125 MG IJ SOLR
125.0000 mg | Freq: Once | INTRAMUSCULAR | Status: AC
  Administered 2014-05-27: 125 mg via INTRAMUSCULAR

## 2014-05-27 MED ORDER — HYDROXYZINE HCL 25 MG PO TABS: 25.0000 mg | ORAL_TABLET | Freq: Three times a day (TID) | ORAL | Status: DC | PRN

## 2014-05-27 MED ORDER — PREDNISONE 20 MG PO TABS: ORAL_TABLET | ORAL | Status: AC

## 2014-05-27 NOTE — Addendum Note (Signed)
Addended by: Wyline Beady on: 05/27/2014 09:14 AM   Modules accepted: Orders

## 2014-05-27 NOTE — Progress Notes (Signed)
CC: Robin Deleon is a 43 y.o. female is here for allergic reaction?   Subjective: HPI:  Rash on the face that developed last night. It's worsening on hourly basis. Originally it was just around the neck now involves the entire face some of the left eyelid and the majority of the left ear. She knows of no known allergic/irritation exposure. She's had no change in personal care products. She denies any new dietary changes. She denies any skin irritation elsewhere on the body. It is itchy and this is slightly improved with using topical cortisone cream. Slight improvement with using Benadryl as well.  She was [place] in the [place] over the weekend however was not exposed to any known poison ivy or other plants that she is allergic to. She denies fevers, chills, wheezing, flushing, nausea, vomiting, difficulty swallowing or difficulty breathing. Denies angioedema   Review Of Systems Outlined In HPI  Past Medical History  Diagnosis Date  . Depression   . High cholesterol   . OSA on CPAP     "been using mask probably 3 yr" (09/10/2012)  . Chronic lower back pain   . Kidney stones     "never treated; just passed them" (09/10/2012)    Past Surgical History  Procedure Laterality Date  . Anterior fusion lumbar spine  09/10/2012    "L5-S1" (09/10/2012)  . Intrauterine device insertion  ~ 06/2012    "that's my 3rd" (09/10/2012)  . Anterior lumbar fusion N/A 09/10/2012    Procedure: LUMBAR FIVE-SACRAL ONE ANTERIOR LUMBAR INTERBODY FUSION ;  Surgeon: Barnett Abu, MD;  Location: [facility];  Service: Neurosurgery;  Laterality: N/A;  Lumbar five-sacral one anterior lumbar interbody fusion   Family History  Problem Relation Age of Onset  . Heart disease Mother   . Diabetes Mother   . Hyperlipidemia Mother   . Thyroid disease Mother   . Cancer Sister   . Thyroid disease Sister   . Heart disease Brother   . Bipolar disorder Brother   . Thyroid disease Brother   . Heart disease Maternal  Grandfather     History   Social History  . Marital Status: Married    Spouse Name: N/A    Number of Children: N/A  . Years of Education: N/A   Occupational History  . Not on file.   Social History Main Topics  . Smoking status: Never Smoker   . Smokeless tobacco: Never Used  . Alcohol Use: 0.0 oz/week    0 Glasses of wine, 0 Shots of liquor per week     Comment: 09/10/2012 "less than 1 shot or glass of wine/week"  . Drug Use: No  . Sexual Activity: Yes    Birth Control/ Protection: IUD   Other Topics Concern  . Not on file   Social History Narrative     Objective: BP 132/80 mmHg  Pulse 78  Wt 224 lb (101.606 kg)  General: Alert and Oriented, No Acute Distress HEENT: Pupils equal, round, reactive to light. Conjunctivae clear.  Moist mucous membranes. Lungs: Clear to auscultation bilaterally, no wheezing/ronchi/rales.  Comfortable work of breathing. Good air movement. Cardiac: Regular rate and rhythm. Extremities: No peripheral edema.  Strong peripheral pulses.  Mental Status: No depression, anxiety, nor agitation. Skin: Warm and dry. From the neck down exposed skin is unremarkable. From the neck.she has coalescing erythematous wheals and papules involving the majority of the face but sparing the eyebrows and scalp.  Assessment & Plan: Robin Deleon was seen today for  allergic reaction?.  Diagnoses and associated orders for this visit:  Contact dermatitis - predniSONE (DELTASONE) 20 MG tablet; Three tabs daily days 1-3, two tabs daily days 4-6, one tab daily days 7-9, half tab daily days 10-13.  Other Orders - hydrOXYzine (ATARAX/VISTARIL) 25 MG tablet; Take 1 tablet (25 mg total) by mouth 3 (three) times daily as needed for itching.    Contact dermatitis: Stop cortisone on the face, begin prednisone taper. Given that the rash is spreading somewhat quickly and we are in the early stages she will receive 125 mg of Solu-Medrol today in addition to prednisone above.  Hydroxyzine as needed.Signs and symptoms requring emergent/urgent reevaluation were discussed with the patient.  Return if symptoms worsen or fail to improve.

## 2014-07-01 ENCOUNTER — Ambulatory Visit (INDEPENDENT_AMBULATORY_CARE_PROVIDER_SITE_OTHER): Payer: 59 | Admitting: Family Medicine

## 2014-07-01 ENCOUNTER — Encounter: Payer: Self-pay | Admitting: Family Medicine

## 2014-07-01 VITALS — BP 124/76 | HR 92 | Temp 97.8°F | Wt 223.0 lb

## 2014-07-01 DIAGNOSIS — H109 Unspecified conjunctivitis: Secondary | ICD-10-CM

## 2014-07-01 MED ORDER — POLYMYXIN B-TRIMETHOPRIM 10000-0.1 UNIT/ML-% OP SOLN: 2.0000 [drp] | OPHTHALMIC | Status: DC

## 2014-07-01 NOTE — Progress Notes (Signed)
CC: Robin Deleon is a 44 y.o. female is here for Conjunctivitis   Subjective: HPI:  Itching and redness of both eyes that have been present for the past 2-3 days. Accompanied by nasal congestion and nonproductive cough that has been present for about 4 days that was starting to appear like it was improving. Reports white thick discharge and crusting from both eyes. Interventions have included eye drops with only mild improvement.symptoms are mild-to-moderate in severity. They've not been getting better or worse since onset. Present all hours of the day. Denies photophobia, ocular pain, pain with moving the eyes, fevers, chills, nor vision loss.   Review Of Systems Outlined In HPI  Past Medical History  Diagnosis Date  . Depression   . High cholesterol   . OSA on CPAP     "been using mask probably 3 yr" (09/10/2012)  . Chronic lower back pain   . Kidney stones     "never treated; just passed them" (09/10/2012)    Past Surgical History  Procedure Laterality Date  . Anterior fusion lumbar spine  09/10/2012    "L5-S1" (09/10/2012)  . Intrauterine device insertion  ~ 06/2012    "that's my 3rd" (09/10/2012)  . Anterior lumbar fusion N/A 09/10/2012    Procedure: LUMBAR FIVE-SACRAL ONE ANTERIOR LUMBAR INTERBODY FUSION ;  Surgeon: Barnett Abu, MD;  Location: [facility];  Service: Neurosurgery;  Laterality: N/A;  Lumbar five-sacral one anterior lumbar interbody fusion   Family History  Problem Relation Age of Onset  . Heart disease Mother   . Diabetes Mother   . Hyperlipidemia Mother   . Thyroid disease Mother   . Cancer Sister   . Thyroid disease Sister   . Heart disease Brother   . Bipolar disorder Brother   . Thyroid disease Brother   . Heart disease Maternal Grandfather     History   Social History  . Marital Status: Married    Spouse Name: N/A    Number of Children: N/A  . Years of Education: N/A   Occupational History  . Not on file.   Social History Main Topics   . Smoking status: Never Smoker   . Smokeless tobacco: Never Used  . Alcohol Use: 0.0 oz/week    0 Glasses of wine, 0 Shots of liquor per week     Comment: 09/10/2012 "less than 1 shot or glass of wine/week"  . Drug Use: No  . Sexual Activity: Yes    Birth Control/ Protection: IUD   Other Topics Concern  . Not on file   Social History Narrative     Objective: BP 124/76 mmHg  Pulse 92  Temp(Src) 97.8 F (36.6 C) (Oral)  Wt 223 lb (101.152 kg)  General: Alert and Oriented, No Acute Distress HEENT: Pupils equal, round, reactive to light. Bilateral conjunctiva are moderately erythematous mostly in the periphery and improving the closer to the limbus. Anterior chamber appears open without debris bilaterally..  External ears unremarkable, canals clear with intact TMs with appropriate landmarks.  Middle ear appears open without effusion. Pink inferior turbinates.  Moist mucous membranes, pharynx without inflammation nor lesions.  Neck supple without palpable lymphadenopathy nor abnormal masses. Lungs: Clear to auscultation bilaterally, no wheezing/ronchi/rales.  Comfortable work of breathing. Good air movement. Extremities: No peripheral edema.  Strong peripheral pulses.  Mental Status: No depression, anxiety, nor agitation. Skin: Warm and dry.  Assessment & Plan: [name] was seen today for conjunctivitis.  Diagnoses and associated orders for this visit:  Bilateral conjunctivitis - trimethoprim-polymyxin b (POLYTRIM) ophthalmic solution; Place 2 drops into both eyes every 4 (four) hours. For one week.    Bacterial conjunctivitis: Start Polytrim discussed contagious nature of her condition.Signs and symptoms requring emergent/urgent reevaluation were discussed with the patient.  Return if symptoms worsen or fail to improve.

## 2014-07-09 ENCOUNTER — Telehealth: Payer: Self-pay | Admitting: *Deleted

## 2014-07-09 NOTE — Telephone Encounter (Signed)
Pt requests a cough medication be called into her pharm. [facility] [employer]

## 2014-07-10 MED ORDER — BENZONATATE 200 MG PO CAPS: 200.0000 mg | ORAL_CAPSULE | Freq: Two times a day (BID) | ORAL | Status: DC | PRN

## 2014-07-10 NOTE — Telephone Encounter (Signed)
Verdia Kuba pearles sent to her [facility].  If this is ineffective I'm ok with sending Hycodan cough syrup.

## 2014-07-10 NOTE — Telephone Encounter (Signed)
Message left on vm 

## 2014-07-28 ENCOUNTER — Encounter: Payer: Self-pay | Admitting: Family Medicine

## 2014-07-28 ENCOUNTER — Ambulatory Visit (INDEPENDENT_AMBULATORY_CARE_PROVIDER_SITE_OTHER): Payer: 59 | Admitting: Family Medicine

## 2014-07-28 VITALS — BP 116/70 | HR 85 | Temp 98.1°F | Wt 224.0 lb

## 2014-07-28 DIAGNOSIS — J029 Acute pharyngitis, unspecified: Secondary | ICD-10-CM

## 2014-07-28 DIAGNOSIS — E663 Overweight: Secondary | ICD-10-CM

## 2014-07-28 MED ORDER — BECLOMETHASONE DIPROPIONATE 80 MCG/ACT NA AERS: INHALATION_SPRAY | NASAL | Status: DC

## 2014-07-28 MED ORDER — PHENTERMINE HCL 37.5 MG PO TABS: 37.5000 mg | ORAL_TABLET | Freq: Every day | ORAL | Status: DC

## 2014-07-28 NOTE — Progress Notes (Signed)
CC: Robin Deleon is a 44 y.o. female is here for Sore Throat   Subjective: HPI:   complaint of sore throat that has been present for the majority of February. It comes and goes on a daily basis. She at least gets one episode a day. Symptoms were last for matter hours will come on without any warning and will go away without any intervention. Symptoms are mild to moderate in severity when present. Symptoms are worse with swallowing or eating. She's tried ibuprofen without much benefit. No other interventions as of yet. Reports occasional dry cough but no persistent cough. Denies fevers, chills, wheezing, choking, swollen lymph nodes, nor nasal congestion. Denies facial pain. Symptoms do not seem to follow any pattern with respect to time of day   complains of difficulty losing weight for the past at least year. She reports that portion control and determining whether or not she is full seems to be her biggest  Her with losing weight. She's begun an exercise program biplane basketball once a week  As of this month.   Review Of Systems Outlined In HPI  Past Medical History  Diagnosis Date  . Depression   . High cholesterol   . OSA on CPAP     "been using mask probably 3 yr" (09/10/2012)  . Chronic lower back pain   . Kidney stones     "never treated; just passed them" (09/10/2012)    Past Surgical History  Procedure Laterality Date  . Anterior fusion lumbar spine  09/10/2012    "L5-S1" (09/10/2012)  . Intrauterine device insertion  ~ 06/2012    "that's my 3rd" (09/10/2012)  . Anterior lumbar fusion N/A 09/10/2012    Procedure: LUMBAR FIVE-SACRAL ONE ANTERIOR LUMBAR INTERBODY FUSION ;  Surgeon: Barnett Abu, MD;  Location: [facility];  Service: Neurosurgery;  Laterality: N/A;  Lumbar five-sacral one anterior lumbar interbody fusion   Family History  Problem Relation Age of Onset  . Heart disease Mother   . Diabetes Mother   . Hyperlipidemia Mother   . Thyroid disease Mother   .  Cancer Sister   . Thyroid disease Sister   . Heart disease Brother   . Bipolar disorder Brother   . Thyroid disease Brother   . Heart disease Maternal Grandfather     History   Social History  . Marital Status: Married    Spouse Name: N/A  . Number of Children: N/A  . Years of Education: N/A   Occupational History  . Not on file.   Social History Main Topics  . Smoking status: Never Smoker   . Smokeless tobacco: Never Used  . Alcohol Use: 0.0 oz/week    0 Glasses of wine, 0 Shots of liquor per week     Comment: 09/10/2012 "less than 1 shot or glass of wine/week"  . Drug Use: No  . Sexual Activity: Yes    Birth Control/ Protection: IUD   Other Topics Concern  . Not on file   Social History Narrative     Objective: BP 116/70 mmHg  Pulse 85  Temp(Src) 98.1 F (36.7 C) (Oral)  Wt 224 lb (101.606 kg)  General: Alert and Oriented, No Acute Distress HEENT: Pupils equal, round, reactive to light. Conjunctivae clear.  External ears unremarkable, canals clear with intact TMs with appropriate landmarks.  Middle ear appears open without effusion. Pink inferior turbinates.  Moist mucous membranes, pharynx without inflammation nor lesions.  Neck supple without palpable lymphadenopathy nor abnormal masses. Lungs:  Clear to auscultation bilaterally, no wheezing/ronchi/rales.  Comfortable work of breathing. Good air movement. Cardiac: Regular rate and rhythm. Normal S1/S2.  No murmurs, rubs, nor gallops.   Abdomen: obese Extremities: No peripheral edema.  Strong peripheral pulses.  Mental Status: No depression, anxiety, nor agitation. Skin: Warm and dry.  Assessment & Plan: Hudson was seen today for sore throat.  Diagnoses and all orders for this visit:  Overweight Orders: -     phentermine (ADIPEX-P) 37.5 MG tablet; Take 1 tablet (37.5 mg total) by mouth daily before breakfast.  Sore throat  Other orders -     Beclomethasone Dipropionate (QNASL) 80 MCG/ACT AERS; Two puffs  each nostril daily    sore throat: Suspect postnasal drip is causing this therefore she was given samples of qnasl today, call if no better in one week  Overweight: Discussed diet medication options she is interested in starting phentermine and will do research on other new medications on the market.  Return in about 4 weeks (around 08/25/2014) for Nurse Visit Weight Check.

## 2014-07-28 NOTE — Patient Instructions (Signed)
Belviq, Contrave, Qsymia 

## 2014-08-13 LAB — HM PAP SMEAR

## 2014-08-26 ENCOUNTER — Ambulatory Visit (INDEPENDENT_AMBULATORY_CARE_PROVIDER_SITE_OTHER): Payer: 59 | Admitting: Family Medicine

## 2014-08-26 VITALS — BP 131/82 | HR 92 | Ht 68.0 in | Wt 211.0 lb

## 2014-08-26 DIAGNOSIS — E663 Overweight: Secondary | ICD-10-CM | POA: Diagnosis not present

## 2014-08-26 DIAGNOSIS — R635 Abnormal weight gain: Secondary | ICD-10-CM | POA: Diagnosis not present

## 2014-08-26 MED ORDER — PHENTERMINE HCL 37.5 MG PO TABS: 37.5000 mg | ORAL_TABLET | Freq: Every day | ORAL | Status: DC

## 2014-08-26 NOTE — Progress Notes (Signed)
   Subjective:    Patient ID: Robin Deleon, female    DOB: 1970/12/27, 44 y.o.   MRN: [id number]  HPI Patient is here for blood pressure and weight check. Denies any trouble sleeping, palpitations, or any other medication problems. Patient states she has made some dietary changes and began exercising.   Review of Systems     Objective:   Physical Exam        Assessment & Plan:  Patient has lost weight. A refill for Phentermine will be sent to patient preferred [facility] pharmacy. Patient advised to schedule a four week nurse visit and keep her upcoming appointment with her PCP. Verbalized understanding, no further questions.

## 2014-08-26 NOTE — Progress Notes (Signed)
Weight loss success, refill today.

## 2015-02-09 ENCOUNTER — Ambulatory Visit (INDEPENDENT_AMBULATORY_CARE_PROVIDER_SITE_OTHER): Payer: 59 | Admitting: Family Medicine

## 2015-02-09 ENCOUNTER — Encounter: Payer: Self-pay | Admitting: Family Medicine

## 2015-02-09 VITALS — BP 122/72 | HR 78 | Temp 98.4°F | Ht 69.0 in | Wt 213.0 lb

## 2015-02-09 DIAGNOSIS — E663 Overweight: Secondary | ICD-10-CM | POA: Diagnosis not present

## 2015-02-09 DIAGNOSIS — R591 Generalized enlarged lymph nodes: Secondary | ICD-10-CM | POA: Diagnosis not present

## 2015-02-09 DIAGNOSIS — R232 Flushing: Secondary | ICD-10-CM | POA: Diagnosis not present

## 2015-02-09 MED ORDER — DESVENLAFAXINE SUCCINATE ER 50 MG PO TB24
50.0000 mg | ORAL_TABLET | Freq: Every day | ORAL | Status: DC
Start: 2015-02-09 — End: 2016-09-07

## 2015-02-09 MED ORDER — PHENTERMINE HCL 37.5 MG PO TABS: 37.5000 mg | ORAL_TABLET | Freq: Every day | ORAL | Status: DC

## 2015-02-09 MED ORDER — CLINDAMYCIN HCL 300 MG PO CAPS: 300.0000 mg | ORAL_CAPSULE | Freq: Three times a day (TID) | ORAL | Status: DC

## 2015-02-09 NOTE — Progress Notes (Signed)
CC: Robin Deleon is a 44 y.o. female is here for Ear Pain   Subjective: HPI:  Right ear pain this been present over the weekend and not improving. Its described only as a pain and some swelling in the ear. She's had some nasal congestion but feels well otherwise. No interventions as of yet. Symptoms began a day or 2 after a bug bite just above the ear. Symptoms are present all hours of the day and moderate in severity. Denies fevers, chills, sore throat, hearing loss, drainage from ear, nor headache.  Complains of nightly hot flashes that are interrupting sleep on a nightly basis that have been present for a few months now but are becoming more annoying due to interrupted sleep. She wants know if she can take medicine for this. She's been on Pristiq for depression in the past  Requesting a refill on phentermine, she has decided that she is going to start aggressively dieting and trying to lose weight.  Review Of Systems Outlined In HPI  Past Medical History  Diagnosis Date  . Depression   . High cholesterol   . OSA on CPAP     "been using mask probably 3 yr" (09/10/2012)  . Chronic lower back pain   . Kidney stones     "never treated; just passed them" (09/10/2012)    Past Surgical History  Procedure Laterality Date  . Anterior fusion lumbar spine  09/10/2012    "L5-S1" (09/10/2012)  . Intrauterine device insertion  ~ 06/2012    "that's my 3rd" (09/10/2012)  . Anterior lumbar fusion N/A 09/10/2012    Procedure: LUMBAR FIVE-SACRAL ONE ANTERIOR LUMBAR INTERBODY FUSION ;  Surgeon: Barnett Abu, MD;  Location: [facility];  Service: Neurosurgery;  Laterality: N/A;  Lumbar five-sacral one anterior lumbar interbody fusion   Family History  Problem Relation Age of Onset  . Heart disease Mother   . Diabetes Mother   . Hyperlipidemia Mother   . Thyroid disease Mother   . Cancer Sister   . Thyroid disease Sister   . Heart disease Brother   . Bipolar disorder Brother   . Thyroid  disease Brother   . Heart disease Maternal Grandfather     Social History   Social History  . Marital Status: Married    Spouse Name: N/A  . Number of Children: N/A  . Years of Education: N/A   Occupational History  . Not on file.   Social History Main Topics  . Smoking status: Never Smoker   . Smokeless tobacco: Never Used  . Alcohol Use: 0.0 oz/week    0 Glasses of wine, 0 Shots of liquor per week     Comment: 09/10/2012 "less than 1 shot or glass of wine/week"  . Drug Use: No  . Sexual Activity: Yes    Birth Control/ Protection: IUD   Other Topics Concern  . Not on file   Social History Narrative     Objective: BP 122/72 mmHg  Pulse 78  Temp(Src) 98.4 F (36.9 C) (Oral)  Ht 5\' 9"  (1.753 m)  Wt 213 lb (96.616 kg)  BMI 31.44 kg/m2  General: Alert and Oriented, No Acute Distress HEENT: Pupils equal, round, reactive to light. Conjunctivae clear.  External ears unremarkable, canals clear with intact TMs with appropriate landmarks.  Middle ear appears open without effusion. Pink inferior turbinates.  Moist mucous membranes, pharynx without inflammation nor lesions.  2 small tender lymph nodes just anterior to the ear and beneath the ear.  Lungs: clear and comfortable work of breathing Extremities: No peripheral edema.  Strong peripheral pulses.  Mental Status: No depression, anxiety, nor agitation. Skin: Warm and dry. Small healing bug bite above the right ear in the scalp  Assessment & Plan: Kaleigha was seen today for ear pain.  Diagnoses and all orders for this visit:  Vasomotor flushing -     desvenlafaxine (PRISTIQ) 50 MG 24 hr tablet; Take 1 tablet (50 mg total) by mouth daily.  Overweight -     phentermine (ADIPEX-P) 37.5 MG tablet; Take 1 tablet (37.5 mg total) by mouth daily before breakfast.  Lymphadenopathy  Other orders -     clindamycin (CLEOCIN) 300 MG capsule; Take 1 capsule (300 mg total) by mouth 3 (three) times daily.   Suspect her  lymphadenopathy is due to A mildly infected bug bite above the right ear therefore start clindamycin. Signs and symptoms requring emergent/urgent reevaluation were discussed with the patient. Overweight: Restarting phentermine, congratulated her decision to start aggressively dieting also urged her to engage in physical activity daily. F/U one month. Vasomotor flushing: Restart former dose of Pristiq since this should help limit severity and frequency of her hot flashes.   25 minutes spent face-to-face during visit today of which at least 50% was counseling or coordinating care regarding: 1. Vasomotor flushing   2. Overweight   3. Lymphadenopathy      Return if symptoms worsen or fail to improve.

## 2016-09-07 ENCOUNTER — Ambulatory Visit (INDEPENDENT_AMBULATORY_CARE_PROVIDER_SITE_OTHER): Payer: 59 | Admitting: Osteopathic Medicine

## 2016-09-07 ENCOUNTER — Encounter: Payer: Self-pay | Admitting: Osteopathic Medicine

## 2016-09-07 VITALS — BP 130/69 | HR 80 | Temp 97.5°F | Wt 215.0 lb

## 2016-09-07 DIAGNOSIS — J0140 Acute pansinusitis, unspecified: Secondary | ICD-10-CM

## 2016-09-07 DIAGNOSIS — Z0000 Encounter for general adult medical examination without abnormal findings: Secondary | ICD-10-CM

## 2016-09-07 DIAGNOSIS — E559 Vitamin D deficiency, unspecified: Secondary | ICD-10-CM

## 2016-09-07 DIAGNOSIS — Z3009 Encounter for other general counseling and advice on contraception: Secondary | ICD-10-CM

## 2016-09-07 DIAGNOSIS — Z87898 Personal history of other specified conditions: Secondary | ICD-10-CM

## 2016-09-07 DIAGNOSIS — R05 Cough: Secondary | ICD-10-CM

## 2016-09-07 DIAGNOSIS — R059 Cough, unspecified: Secondary | ICD-10-CM

## 2016-09-07 MED ORDER — BENZONATATE 200 MG PO CAPS: 200.0000 mg | ORAL_CAPSULE | Freq: Three times a day (TID) | ORAL | 0 refills | Status: DC | PRN

## 2016-09-07 MED ORDER — AMOXICILLIN-POT CLAVULANATE 875-125 MG PO TABS: 1.0000 | ORAL_TABLET | Freq: Two times a day (BID) | ORAL | 0 refills | Status: DC

## 2016-09-07 MED ORDER — FLUCONAZOLE 150 MG PO TABS: 150.0000 mg | ORAL_TABLET | Freq: Once | ORAL | 1 refills | Status: AC

## 2016-09-07 NOTE — Patient Instructions (Addendum)
Sinus Congestion/Allergy treatment  We are treating for bacterial sinus infection today with antibiotic  For allergies:  Flonase, Nasonex or one of their generics (nasal steroid)   Can add: Zyrtec, Claritin, Allegra or one of their generics (antihistamine)  Can also add: Sudafed (decongestant) alone or as Zyrtec-D etc  Can also add: Nasal Saline if desired  For severe congestion: Oxymetolazone (Afrin, others) for severe symptoms - sparing use due to rebound congestion, use no more than 3 days  If symptoms persist despite above treatment for 1-2 weeks, please call the office  If worse or change before then, call us ASAP  After that, if no better, Ear/Nose/Throat specialist referral or would consider alternative antibiotic +/- CT sinuses  Other:   Annual physical with labs ahead of time  Mirena switch when due

## 2016-09-07 NOTE — Progress Notes (Signed)
HPI: Robin Deleon is a 46 y.o. female  who presents to [facility] today, 09/07/16,  for chief complaint of:  Chief Complaint  Patient presents with  . Nasal Congestion    x3 weeks    Nasal congestion 3 weeks, worse in the evenings, over-the-counter medications have not helped. History of allergies, did not use nasal spray consistently, was nervous to be on steroids for long-term. Associated with intermittent cough, no fever or chills.  History of prediabetes: Patient has not had labs in some time. Not paying particular attention to low carbohydrate diet.  History of vitamin D deficiency: Patient states has been low despite supplementation in the past.  IUD in place: Patient would like to continue with Mirena when due to have it replaced in August of later this year. Prefers not having periods.   Past medical history, surgical history, social history and family history reviewed.  Patient Active Problem List   Diagnosis Date Noted  . Rheumatoid arthritis (HCC) 08/29/2013  . Depression 08/16/2013  . Obstructive sleep apnea 08/16/2013  . Prediabetes 08/16/2013  . Hyperlipidemia 08/16/2013  . Tension headache 08/16/2013  . Fatigue 08/16/2013  . Skin lesion 08/16/2013    Current medication list and allergy/intolerance information reviewed.   Current Outpatient Prescriptions on File Prior to Visit  Medication Sig Dispense Refill  . butalbital-aspirin-caffeine (FIORINAL) 50-325-40 MG per capsule Take 1 capsule by mouth daily as needed for headache. 30 capsule 0  . cetirizine (ZYRTEC) 10 MG chewable tablet Chew 10 mg by mouth 2 (two) times daily.    Marland Kitchen ibuprofen (ADVIL,MOTRIN) 800 MG tablet Take 1 tablet (800 mg total) by mouth every 8 (eight) hours as needed. For pain 60 tablet 1   No current facility-administered medications on file prior to visit.    Allergies  Allergen Reactions  . Doxycycline     Sick on stomach   . Latex     Rash    . Tape Other (See Comments)    Adhesive tape causes blisters.  May use paper tape.      Review of Systems:  Constitutional: +recent illness  HEENT: +sinus headache, no vision change  Cardiac: No  chest pain, No  pressure, No palpitations  Respiratory:  No  shortness of breath. +Cough  Gastrointestinal: No  abdominal pain, no change on bowel habits  Musculoskeletal: No new myalgia/arthralgia  Skin: No  Rash  Neurologic: No  weakness, No  Dizziness   Exam:  BP 130/69   Pulse 80   Temp 97.5 F (36.4 C) (Oral)   Wt 215 lb (97.5 kg)   BMI 31.75 kg/m   Constitutional: VS see above. General Appearance: alert, well-developed, well-nourished, NAD  Eyes: Normal lids and conjunctive, non-icteric sclera  Head. Ears, Nose, Mouth, Throat: MMM, Normal external inspection ears/nares/mouth/lips/gums. TM normal bilaterally. No pharyngeal erythema/exudate. Tenderness over frontal and maxillary sinuses bilaterally.  Neck: No masses, trachea midline. No cervical lymphadenopathy  Respiratory: Normal respiratory effort. no wheeze, no rhonchi, no rales  Cardiovascular: S1/S2 normal, no murmur, no rub/gallop auscultated. RRR.   Musculoskeletal: Gait normal. Symmetric and independent movement of all extremities  Neurological: Normal balance/coordination. No tremor.  Skin: warm, dry, intact.   Psychiatric: Normal judgment/insight. Normal mood and affect. Oriented x3.      ASSESSMENT/PLAN:   Acute pansinusitis, recurrence not specified - Plan: amoxicillin-clavulanate (AUGMENTIN) 875-125 MG tablet  Cough - Plan: benzonatate (TESSALON) 200 MG capsule  Annual physical exam - Labs ordered for future visit,  preventive care visit was not coded or performed today - Plan: CBC with Differential/Platelet, COMPLETE METABOLIC PANEL WITH GFR, Lipid panel, VITAMIN D 25 Hydroxy (Vit-D Deficiency, Fractures)  History of prediabetes - Plan: Hemoglobin A1c  Vitamin D deficiency  Counseling  for birth control regarding intrauterine device (IUD)    Patient Instructions  Sinus Congestion/Allergy treatment  We are treating for bacterial sinus infection today with antibiotic  For allergies:  Flonase, Nasonex or one of their generics (nasal steroid)   Can add: Zyrtec, Claritin, Allegra or one of their generics (antihistamine)  Can also add: Sudafed (decongestant) alone or as Zyrtec-D etc  Can also add: Nasal Saline if desired  For severe congestion: Oxymetolazone (Afrin, others) for severe symptoms - sparing use due to rebound congestion, use no more than 3 days  If symptoms persist despite above treatment for 1-2 weeks, please call the office  If worse or change before then, call us ASAP  After that, if no better, Ear/Nose/Throat specialist referral or would consider alternative antibiotic +/- CT sinuses  Other:   Annual physical with labs ahead of time  Mirena switch when due     Follow-up plan: Return for annual wellness physical next few months, Mirena switch-out when due, as needed for sinuses.  Visit summary with medication list and pertinent instructions was printed for patient to review, alert [place] if any changes needed. All questions at time of visit were answered - patient instructed to contact office with any additional concerns. ER/RTC precautions were reviewed with the patient and understanding verbalized.   Note: Total time spent 25 minutes, greater than 50% of the visit was spent face-to-face counseling and coordinating care for the following: The primary encounter diagnosis was Acute pansinusitis, recurrence not specified. Diagnoses of Cough, Annual physical exam, History of prediabetes, Vitamin D deficiency, and Counseling for birth control regarding intrauterine device (IUD) were also pertinent to this visit.Marland Kitchen

## 2016-09-07 NOTE — Addendum Note (Signed)
Addended by: Deirdre Pippins on: 09/07/2016 01:53 PM   Modules accepted: Orders

## 2016-11-24 LAB — CBC WITH DIFFERENTIAL/PLATELET
BASOS ABS: 0 {cells}/uL (ref 0–200)
Basophils Relative: 0 %
EOS ABS: 156 {cells}/uL (ref 15–500)
EOS PCT: 2 %
HCT: 41.8 % (ref 35.0–45.0)
Hemoglobin: 14.2 g/dL (ref 11.7–15.5)
LYMPHS ABS: 2028 {cells}/uL (ref 850–3900)
Lymphocytes Relative: 26 %
MCH: 29.8 pg (ref 27.0–33.0)
MCHC: 34 g/dL (ref 32.0–36.0)
MCV: 87.6 fL (ref 80.0–100.0)
MPV: 11 fL (ref 7.5–12.5)
Monocytes Absolute: 468 cells/uL (ref 200–950)
Monocytes Relative: 6 %
NEUTROS ABS: 5148 {cells}/uL (ref 1500–7800)
Neutrophils Relative %: 66 %
Platelets: 202 10*3/uL (ref 140–400)
RBC: 4.77 MIL/uL (ref 3.80–5.10)
RDW: 13.5 % (ref 11.0–15.0)
WBC: 7.8 10*3/uL (ref 3.8–10.8)

## 2016-11-25 LAB — VITAMIN D 25 HYDROXY (VIT D DEFICIENCY, FRACTURES): VIT D 25 HYDROXY: 16 ng/mL — AB (ref 30–100)

## 2016-11-25 LAB — LIPID PANEL
Cholesterol: 221 mg/dL — ABNORMAL HIGH (ref <200)
HDL: 38 mg/dL — AB (ref >50)
LDL Cholesterol: 152 mg/dL — ABNORMAL HIGH (ref <100)
Total CHOL/HDL Ratio: 5.8 Ratio — ABNORMAL HIGH (ref <5.0)
Triglycerides: 154 mg/dL — ABNORMAL HIGH (ref <150)
VLDL: 31 mg/dL — ABNORMAL HIGH (ref <30)

## 2016-11-25 LAB — COMPLETE METABOLIC PANEL WITH GFR
ALK PHOS: 58 U/L (ref 33–115)
ALT: 12 U/L (ref 6–29)
AST: 19 U/L (ref 10–35)
Albumin: 4.2 g/dL (ref 3.6–5.1)
BUN: 12 mg/dL (ref 7–25)
CO2: 25 mmol/L (ref 20–31)
Calcium: 9.3 mg/dL (ref 8.6–10.2)
Chloride: 105 mmol/L (ref 98–110)
Creat: 0.61 mg/dL (ref 0.50–1.10)
GFR, Est African American: >89 mL/min (ref >=60)
Glucose, Bld: 121 mg/dL — ABNORMAL HIGH (ref 65–99)
Potassium: 4.1 mmol/L (ref 3.5–5.3)
Sodium: 139 mmol/L (ref 135–146)
TOTAL PROTEIN: 6.4 g/dL (ref 6.1–8.1)
Total Bilirubin: 1.1 mg/dL (ref 0.2–1.2)

## 2016-11-25 LAB — HEMOGLOBIN A1C
Hgb A1c MFr Bld: 5.6 % (ref <5.7)
Mean Plasma Glucose: 114 mg/dL

## 2016-12-05 ENCOUNTER — Encounter: Payer: Self-pay | Admitting: Osteopathic Medicine

## 2016-12-05 ENCOUNTER — Ambulatory Visit (INDEPENDENT_AMBULATORY_CARE_PROVIDER_SITE_OTHER): Payer: 59 | Admitting: Osteopathic Medicine

## 2016-12-05 VITALS — BP 105/61 | HR 62 | Wt 213.0 lb

## 2016-12-05 DIAGNOSIS — E782 Mixed hyperlipidemia: Secondary | ICD-10-CM | POA: Diagnosis not present

## 2016-12-05 DIAGNOSIS — Z6831 Body mass index (BMI) 31.0-31.9, adult: Secondary | ICD-10-CM | POA: Diagnosis not present

## 2016-12-05 DIAGNOSIS — Z0000 Encounter for general adult medical examination without abnormal findings: Secondary | ICD-10-CM

## 2016-12-05 DIAGNOSIS — Z87898 Personal history of other specified conditions: Secondary | ICD-10-CM | POA: Diagnosis not present

## 2016-12-05 DIAGNOSIS — E559 Vitamin D deficiency, unspecified: Secondary | ICD-10-CM | POA: Insufficient documentation

## 2016-12-05 DIAGNOSIS — M62838 Other muscle spasm: Secondary | ICD-10-CM | POA: Diagnosis not present

## 2016-12-05 DIAGNOSIS — M542 Cervicalgia: Secondary | ICD-10-CM | POA: Insufficient documentation

## 2016-12-05 MED ORDER — DICLOFENAC SODIUM 1 % TD GEL: 4.0000 g | Freq: Four times a day (QID) | TRANSDERMAL | 11 refills | Status: DC

## 2016-12-05 MED ORDER — VITAMIN D (ERGOCALCIFEROL) 1.25 MG (50000 UNIT) PO CAPS: 50000.0000 [IU] | ORAL_CAPSULE | ORAL | 0 refills | Status: DC

## 2016-12-05 NOTE — Progress Notes (Signed)
HPI: Robin Deleon is a 45 y.o. female  who presents to [facility] today, 12/05/16,  for chief complaint of:  Chief Complaint  Patient presents with  . Results   At last visit, patient was asked to schedule annual physical. She is here today to review lab results. Preventive care reviewed as below.  Additional concerns today include:  Neck pain: Right sided lower neck/shoulder pain, described as muscle tightness, occasional difficulty with neck rotation, pain with muscle tension. Denies dizziness/lightheadedness, decreased grip strength, shooting pain down arm or weakness in arm.  Weight loss: Would like to try appetite suppressant medication. Previously on phentermine in the past which only worked for a few weeks, would like to be on something a bit more long-term.  Prediabetes: A1c shows improvement.  Hyperlipidemia:Elevated cholesterol, option for statin medication but overall ASCVD risk is not in statin benefit group based on all risk factors  Vitamin D deficiency: Patient states that she has been on high-dose supplementation and even injections of vitamin D in the past and levels didn't seem to improve whatsoever. She is open to trying high-dose supplementation for 8-12 weeks and then recheck levels.   Past medical, surgical, social and family history reviewed: Patient Active Problem List   Diagnosis Date Noted  . Rheumatoid arthritis ([place]) 08/29/2013  . Depression 08/16/2013  . Obstructive sleep apnea 08/16/2013  . Prediabetes 08/16/2013  . Hyperlipidemia 08/16/2013  . Tension headache 08/16/2013  . Fatigue 08/16/2013  . Skin lesion 08/16/2013   Past Surgical History:  Procedure Laterality Date  . ANTERIOR FUSION LUMBAR SPINE  09/10/2012   "L5-S1" (09/10/2012)  . ANTERIOR LUMBAR FUSION N/A 09/10/2012   Procedure: LUMBAR FIVE-SACRAL ONE ANTERIOR LUMBAR INTERBODY FUSION ;  Surgeon: Kristeen Miss, MD;  Location: [facility];  Service:  Neurosurgery;  Laterality: N/A;  Lumbar five-sacral one anterior lumbar interbody fusion  . INTRAUTERINE DEVICE INSERTION  ~ 06/2012   "that's my 3rd" (09/10/2012)   Social History  Substance Use Topics  . Smoking status: Never Smoker  . Smokeless tobacco: Never Used  . Alcohol use 0.0 oz/week     Comment: 09/10/2012 "less than 1 shot or glass of wine/week"   Family History  Problem Relation Age of Onset  . Heart disease Mother   . Diabetes Mother   . Hyperlipidemia Mother   . Thyroid disease Mother   . Cancer Sister   . Thyroid disease Sister   . Heart disease Brother   . Bipolar disorder Brother   . Thyroid disease Brother   . Heart disease Maternal Grandfather      Current medication list and allergy/intolerance information reviewed:   Current Outpatient Prescriptions  Medication Sig Dispense Refill  . cetirizine (ZYRTEC) 10 MG chewable tablet Chew 10 mg by mouth 2 (two) times daily.    Marland Kitchen ibuprofen (ADVIL,MOTRIN) 800 MG tablet Take 1 tablet (800 mg total) by mouth every 8 (eight) hours as needed. For pain 60 tablet 1   No current facility-administered medications for this visit.    Allergies  Allergen Reactions  . Doxycycline     Sick on stomach   . Latex     Rash   . Tape Other (See Comments)    Adhesive tape causes blisters.  May use paper tape.      Review of Systems:  Constitutional:  No recent illness, +unintentional weight changes. +significant fatigue.   HEENT: No  headache, no vision change  Cardiac: No  chest pain,  No  pressure  Respiratory:  No  shortness of breath. No  Cough  Gastrointestinal: No  abdominal pain, No  nausea, No  vomiting  Musculoskeletal: +myalgia/arthralgia  Skin: No  Rash, No other wounds/concerning lesions  Endocrine: No cold intolerance,  No heat intolerance. No polyuria/polydipsia/polyphagia   Neurologic: No  weakness, No  dizziness,  Psychiatric: No  concerns with depression, No  concerns with anxiety, No sleep  problems, No mood problems  Exam:  BP 105/61   Pulse 62   Wt 213 lb (96.6 kg)   BMI 31.45 kg/m   Constitutional: VS see above. General Appearance: alert, well-developed, well-nourished, NAD  Eyes: Normal lids and conjunctive, non-icteric sclera  Ears, Nose, Mouth, Throat: MMM, Normal external inspection ears/nares/mouth/lips/gums. T  Neck: No masses, trachea midline  Respiratory: Normal respiratory effort. no wheeze, no rhonchi, no rales  Cardiovascular: S1/S2 normal, no murmur, no rub/gallop auscultated. RRR. No lower extremity edema.   Gastrointestinal: Nontender, no masses. No hepatomegaly, no splenomegaly. No hernia appreciated. Bowel sounds normal. Rectal exam deferred.   Musculoskeletal: Gait normal. No clubbing/cyanosis of digits.   Neurological: Normal balance/coordination. No tremor. No cranial nerve deficit on limited exam.  Skin: warm, dry, intact. No rash/ulcer    Psychiatric: Normal judgment/insight. Normal mood and affect. Oriented x3.   Recent Results (from the past 2160 hour(s))  CBC with Differential/Platelet     Status: None   Collection Time: 11/24/16  2:38 PM  Result Value Ref Range   WBC 7.8 3.8 - 10.8 K/uL   RBC 4.77 3.80 - 5.10 MIL/uL   Hemoglobin 14.2 11.7 - 15.5 g/dL   HCT 41.8 35.0 - 45.0 %   MCV 87.6 80.0 - 100.0 fL   MCH 29.8 27.0 - 33.0 pg   MCHC 34.0 32.0 - 36.0 g/dL   RDW 13.5 11.0 - 15.0 %   Platelets 202 140 - 400 K/uL   MPV 11.0 7.5 - 12.5 fL   Neutro Abs 5,148 1,500 - 7,800 cells/uL   Lymphs Abs 2,028 850 - 3,900 cells/uL   Monocytes Absolute 468 200 - 950 cells/uL   Eosinophils Absolute 156 15 - 500 cells/uL   Basophils Absolute 0 0 - 200 cells/uL   Neutrophils Relative % 66 %   Lymphocytes Relative 26 %   Monocytes Relative 6 %   Eosinophils Relative 2 %   Basophils Relative 0 %   Smear Review Criteria for review not met   COMPLETE METABOLIC PANEL WITH GFR     Status: Abnormal   Collection Time: 11/24/16  2:38 PM  Result  Value Ref Range   Sodium 139 135 - 146 mmol/L   Potassium 4.1 3.5 - 5.3 mmol/L   Chloride 105 98 - 110 mmol/L   CO2 25 20 - 31 mmol/L   Glucose, Bld 121 (H) 65 - 99 mg/dL   BUN 12 7 - 25 mg/dL   Creat 0.61 0.50 - 1.10 mg/dL   Total Bilirubin 1.1 0.2 - 1.2 mg/dL   Alkaline Phosphatase 58 33 - 115 U/L   AST 19 10 - 35 U/L   ALT 12 6 - 29 U/L   Total Protein 6.4 6.1 - 8.1 g/dL   Albumin 4.2 3.6 - 5.1 g/dL   Calcium 9.3 8.6 - 10.2 mg/dL   GFR, Est African American >89 >=60 mL/min   GFR, Est Non African American >89 >=60 mL/min  Lipid panel     Status: Abnormal   Collection Time: 11/24/16  2:38 PM  Result  Value Ref Range   Cholesterol 221 (H) <200 mg/dL   Triglycerides 154 (H) <150 mg/dL   HDL 38 (L) >50 mg/dL   Total CHOL/HDL Ratio 5.8 (H) <5.0 Ratio   VLDL 31 (H) <30 mg/dL   LDL Cholesterol 152 (H) <100 mg/dL  Hemoglobin A1c     Status: None   Collection Time: 11/24/16  2:38 PM  Result Value Ref Range   Hgb A1c MFr Bld 5.6 <5.7 %    Comment:   For the purpose of screening for the presence of diabetes:   <5.7%       Consistent with the absence of diabetes 5.7-6.4 %   Consistent with increased risk for diabetes (prediabetes) >=6.5 %     Consistent with diabetes   This assay result is consistent with a decreased risk of diabetes.   Currently, no consensus exists regarding use of hemoglobin A1c for diagnosis of diabetes in children.   According to American Diabetes Association (ADA) guidelines, hemoglobin A1c <7.0% represents optimal control in non-pregnant diabetic patients. Different metrics may apply to specific patient populations. Standards of Medical Care in Diabetes (ADA).      Mean Plasma Glucose 114 mg/dL  VITAMIN D 25 Hydroxy (Vit-D Deficiency, Fractures)     Status: Abnormal   Collection Time: 11/24/16  2:38 PM  Result Value Ref Range   Vit D, 25-Hydroxy 16 (L) 30 - 100 ng/mL    Comment: Vitamin D Status           25-OH Vitamin D        Deficiency                 <20 ng/mL        Insufficiency         20 - 29 ng/mL        Optimal             > or = 30 ng/mL   For 25-OH Vitamin D testing on patients on D2-supplementation and patients for whom quantitation of D2 and D3 fractions is required, the QuestAssureD 25-OH VIT D, (D2,D3), LC/MS/MS is recommended: order code [id number] (patients > 2 yrs).      ASSESSMENT/PLAN: The patient printed instructions. Annual physical/preventive care was reviewed but patient had some other concerns to address today. It was printed on her instructions that problem-based visit may be charged  Annual physical exam  Muscle spasm - Plan: diclofenac sodium (VOLTAREN) 1 % GEL  Neck pain - Trial topical anti-inflammatory, consider physical therapy, extracted today - Plan: DG CERVICAL SPINE 2 VIEW  History of prediabetes  Adult BMI 31.0-31.9 kg/sq m - Given list of anti-obesity medications, will contact her insurance company  Vitamin D deficiency - 8-12 week high-dose supplementation than recheck levels - Plan: VITAMIN D 25 Hydroxy (Vit-D Deficiency, Fractures)  Mixed hyperlipidemia - Not in statin benefit group, diet/exercise discussed   FEMALE PREVENTIVE CARE Updated 12/05/16   ANNUAL SCREENING/COUNSELING  Diet/Exercise - HEALTHY HABITS DISCUSSED TO DECREASE CV RISK History  Smoking Status  . Never Smoker  Smokeless Tobacco  . Never Used   History  Alcohol Use  . 0.0 oz/week    Comment: 09/10/2012 "less than 1 shot or glass of wine/week"   Depression screen [facility] 2/9 09/07/2016  Decreased Interest 0  Down, Depressed, Hopeless 0  PHQ - 2 Score 0    Domestic violence concerns - no  HTN SCREENING - SEE [place]  Sexually active in the past year -  Yes with female.  Need/want STI testing today? - no  Concerns about libido or pain with sex? - no  Plans for pregnancy? - IUD - 5 years ago  INFECTIOUS DISEASE SCREENING  HIV - does not need  GC/CT - does not need  HepC - DOB [birth year] -  does not need  TB - does not need  DISEASE SCREENING  Lipid - does not need  DM2 - does not need  Osteoporosis - women age 68+ - does not need  CANCER SCREENING  Cervical - does not need - gets through OBGYN  Breast - does not need - through [employer] - does not need  Colon - does not need - (+)FH in cousins   ADULT VACCINATION  Influenza - annual vaccine recommended  Td - booster every 10 years - needs  Zoster - option at 25, yes at 60+   PCV13 - was not indicated  PPSV23 - was not indicated  There is no immunization history on file for this patient.     Visit summary with medication list and pertinent instructions was printed for patient to review. All questions at time of visit were answered - patient instructed to contact office with any additional concerns. ER/RTC precautions were reviewed with the patient. Follow-up plan: Return in about 1 year (around 12/05/2017) for [place], sooner if needed .

## 2016-12-05 NOTE — Patient Instructions (Addendum)
Plan:  See below for weight loss information and medications - if you would like to start a medication, call us and we can write the prescription and go over follow-up plan from there  Xray today for neck and try the topical anti-inflammatory medication, consider sports medicine follow-up here in our office if no better      Weight loss: important things to remember  It is hard work! You will have setbacks, but don't get discouraged. The goal is not short-term success, it is long-term health.   Looking at the numbers is important to track your progress and set goals, but how you are feeling and your overall health are the most important things! BMI and pounds and calories and miles logged aren't everything - they are tools to help [place] reach your goals.  You can do this!!!   Things to remember for exercise for weight loss:   Please note - I am not a certified personal trainer. I can present you with ideas and general workout goals, but an exercise program is largely up to you. Find something you can stick with, and something you enjoy!   As you progress in your exercise regimen think about gradually increasing the following, week by week:   intensity (how strenuous is your workout)  frequency (how often you are exercising)  duration (how many minutes at a time you are exercising)  Walking for 20 minutes a day is certainly better than nothing, but more strenuous exercise will develop better cardiovascular fitness.   interval training (high-intensity alternating with low-intensity, think walk/jog rather than just walk)  muscle strengthening exercises (weight lifting, calisthenics, yoga) - this also helps prevent osteoporosis!   Things to remember for diet changes for weight loss:   Please note - I am not a certified dietician. I can present you with ideas and general diet goals, but a meal plan is largely up to you. I am happy to refer you to a dietician who can give you a detailed  meal plan.  Apps/logs are crucial to track how you're eating! It's not realistic to be logging everything you eat forever, but when you're starting a healthy eating lifestyle it's very helpful, and checking in with logs now and then helps you stick to your program!   Calorie restriction with the goal weight loss of no more than one to one and a half pounds per week.   Increase lean protein such as chicken, fish, [place].   Decrease fatty foods such as dairy, butter.   Decrease sugary foods. Avoid sugary drinks such as soda or juice.  Increase fiber found in fruit and vegetables.   Medications approved for long-term use for obesity  Qsymia (Phentermine and Topiramate)  Saxenda (Liraglutide 3 mg/day) *   Contrave (Bupropion and Naltrexone)  Lorcaserin (Belviq or Belviq XR)  Orlistat (Xenical, Alli)  Bupropion (Wellbutrin) I recommend that you research the above medications and see which one(s) your insurance may or may not cover: If you call your insurance, ask them specifically what medications are on their formulary that are approved for obesity treatment. They should be able to send you a list or tell you over the phone. Remember, medications aren't magic! You MUST be diligent about lifestyle changes as well!     Please note: Preventive care issues were addressed today per annual physical requirements and should be covered under your insurance, however there were other medical issues which were also addressed and insurance may bill you separately for "problem-based  visit" for neck pain and if we start a weight loss medicine. Any questions or concerns about charges which may appear on your statement should be directed to your insurance company or to [facility] billing department, please contact our office with any other questions.

## 2017-02-07 ENCOUNTER — Encounter: Payer: Self-pay | Admitting: Osteopathic Medicine

## 2017-10-26 ENCOUNTER — Encounter: Payer: Self-pay | Admitting: Osteopathic Medicine

## 2017-10-26 ENCOUNTER — Ambulatory Visit (INDEPENDENT_AMBULATORY_CARE_PROVIDER_SITE_OTHER): Payer: 59

## 2017-10-26 ENCOUNTER — Ambulatory Visit: Payer: 59 | Admitting: Osteopathic Medicine

## 2017-10-26 VITALS — BP 110/68 | HR 71 | Temp 97.9°F | Wt 213.0 lb

## 2017-10-26 DIAGNOSIS — M4802 Spinal stenosis, cervical region: Secondary | ICD-10-CM

## 2017-10-26 DIAGNOSIS — R202 Paresthesia of skin: Secondary | ICD-10-CM

## 2017-10-26 DIAGNOSIS — R5382 Chronic fatigue, unspecified: Secondary | ICD-10-CM | POA: Diagnosis not present

## 2017-10-26 DIAGNOSIS — G8929 Other chronic pain: Secondary | ICD-10-CM

## 2017-10-26 DIAGNOSIS — M542 Cervicalgia: Secondary | ICD-10-CM | POA: Diagnosis not present

## 2017-10-26 DIAGNOSIS — R29898 Other symptoms and signs involving the musculoskeletal system: Secondary | ICD-10-CM | POA: Diagnosis not present

## 2017-10-26 DIAGNOSIS — E559 Vitamin D deficiency, unspecified: Secondary | ICD-10-CM

## 2017-10-26 DIAGNOSIS — M50122 Cervical disc disorder at C5-C6 level with radiculopathy: Secondary | ICD-10-CM

## 2017-10-26 NOTE — Progress Notes (Signed)
HPI: Robin Deleon is a 47 y.o. female who  has a past medical history of Chronic lower back pain, Depression, High cholesterol, Kidney stones, and OSA on CPAP.  she presents to [facility] today, 10/26/17,  for chief complaint of:  Arm numbness  Numbness both arms, worse in R, no pain.  She describes it as almost a heaviness, like the arms are asleep but without the tingling sensation.  Occasionally feels like it localizes to digits 1 and 2 bit worse on the right.  No history of carpal tunnel syndrome, occasionally feels loss of grip strength.  Does not seem to be bothering her today.  Has been infrequently happening since early 20's, worse past 6 weeks. Episodes about every other day, last about 10 minutes at a time. No pattern/trigger she can identify other than worse in AM, has noticed it when driving.  No significant worsening of chronic neck pain.  No headache or vision change.  No spinal pain elsewhere.   Last seen in office 11/2016, neck pain at that time: "Right sided lower neck/shoulder pain, described as muscle tightness, occasional difficulty with neck rotation, pain with muscle tension. Denies dizziness/lightheadedness, decreased grip strength, shooting pain down arm or weakness in arm... Trial topical NSAID, consider PT" Xray ordered but pt never went downstairs to get it done    Past medical history, surgical history, and family history reviewed.  Current medication list and allergy/intolerance information reviewed.   (See remainder of HPI, ROS, Phys Exam below)    ASSESSMENT/PLAN:   Chronic fatigue - Plan: CBC, COMPLETE METABOLIC PANEL WITH GFR, TSH, Vitamin B12  Arm heaviness - Plan: CBC, COMPLETE METABOLIC PANEL WITH GFR, TSH, DG Cervical Spine Complete, High sensitivity CRP, Sedimentation rate  Paresthesia of arm - Plan: Vitamin B12, DG Cervical Spine Complete, High sensitivity CRP, Sedimentation rate  Vitamin D deficiency -  Plan: VITAMIN D 25 Hydroxy (Vit-D Deficiency, Fractures)  Chronic neck pain - Plan: DG Cervical Spine Complete     Patient Instructions  Once Xray results are back will likely proceed with MRI C-spine, if your insurance wants physical therapy done first, I might have to argue with them about it - we will see :)   If all imaging normal, and/or ir worse/change, would send to neurology for consult about what we might be missing, further testing, etc.   Labs today may give [place] some insight as well especially if B12 deficiency or other problem present.    Follow-up plan: Return for recheck depending on imaging findings - recommend visit w/ Dr A 2-3 days after MRI to review images .     ############################################ ############################################ ############################################ ############################################    Outpatient Encounter Medications as of 10/26/2017  Medication Sig  . cetirizine (ZYRTEC) 10 MG chewable tablet Chew 10 mg by mouth 2 (two) times daily.  . diclofenac sodium (VOLTAREN) 1 % GEL Apply 4 g topically 4 (four) times daily. To affected joint/muscle  . ibuprofen (ADVIL,MOTRIN) 800 MG tablet Take 1 tablet (800 mg total) by mouth every 8 (eight) hours as needed. For pain  . Vitamin D, Ergocalciferol, (DRISDOL) 50000 units CAPS capsule Take 1 capsule (50,000 Units total) by mouth every 7 (seven) days. Take for 12 total doses(weeks)   No facility-administered encounter medications on file as of 10/26/2017.    Allergies  Allergen Reactions  . Doxycycline     Sick on stomach   . Latex     Rash   . Tape Other (  See Comments)    Adhesive tape causes blisters.  May use paper tape.      Review of Systems:  Constitutional: No recent illness  HEENT: No  headache, no vision change  Cardiac: No  chest pain, No  pressure, No palpitations  Respiratory:  No  shortness of breath. No  Cough  Gastrointestinal: No   abdominal pain  Musculoskeletal: See HPI  Skin: No  Rash  Hem/Onc: No  easy bruising/bleeding, No  abnormal lumps/bumps  Neurologic: +arm weakness, No  Dizziness  Psychiatric: No  concerns with depression, No  concerns with anxiety  Exam:  BP 110/68   Pulse 71   Temp 97.9 F (36.6 C) (Oral)   Wt 213 lb (96.6 kg)   SpO2 98%   BMI 31.45 kg/m   Constitutional: VS see above. General Appearance: alert, well-developed, well-nourished, NAD  Eyes: Normal lids and conjunctive, non-icteric sclera  Ears, Nose, Mouth, Throat: MMM, Normal external inspection ears/nares/mouth/lips/gums.  Neck: No masses, trachea midline.   Respiratory: Normal respiratory effort. no wheeze, no rhonchi, no rales  Cardiovascular: S1/S2 normal, no murmur, no rub/gallop auscultated. RRR.   Musculoskeletal: Gait normal. Symmetric and independent movement of all extremities.  Strength 5 out of 5 in all 4 extremities  Neurological: Normal balance/coordination. No tremor.  Cranial nerves intact on gross exam.  Negative Spurling's test bilaterally.  Negative Tinel's and Phalen's on right wrist.  Skin: warm, dry, intact.   Psychiatric: Normal judgment/insight. Normal mood and affect. Oriented x3.   Visit summary with medication list and pertinent instructions was printed for patient to review, advised to alert [place] if any changes needed. All questions at time of visit were answered - patient instructed to contact office with any additional concerns. ER/RTC precautions were reviewed with the patient and understanding verbalized.   Follow-up plan: Return for recheck depending on imaging findings - recommend visit w/ Dr A 2-3 days after MRI to review images .  Note: Total time spent 25 minutes, greater than 50% of the visit was spent face-to-face counseling and coordinating care for the following: The primary encounter diagnosis was Chronic fatigue. Diagnoses of Arm heaviness, Paresthesia of arm, Vitamin D  deficiency, and Chronic neck pain were also pertinent to this visit.Marland Kitchen  Please note: voice recognition software was used to produce this document, and typos may escape review. Please contact Dr. Lyn Hollingshead for any needed clarifications.

## 2017-10-26 NOTE — Patient Instructions (Signed)
Once Xray results are back will likely proceed with MRI C-spine, if your insurance wants physical therapy done first, I might have to argue with them about it - we will see :)   If all imaging normal, and/or ir worse/change, would send to neurology for consult about what we might be missing, further testing, etc.   Labs today may give [place] some insight as well especially if B12 deficiency or other problem present.

## 2017-10-27 LAB — COMPLETE METABOLIC PANEL WITH GFR
AG RATIO: 2.1 (calc) (ref 1.0–2.5)
ALBUMIN MSPROF: 4.6 g/dL (ref 3.6–5.1)
ALKALINE PHOSPHATASE (APISO): 59 U/L (ref 33–115)
ALT: 14 U/L (ref 6–29)
AST: 23 U/L (ref 10–35)
BUN: 12 mg/dL (ref 7–25)
CO2: 25 mmol/L (ref 20–32)
Calcium: 9.3 mg/dL (ref 8.6–10.2)
Chloride: 107 mmol/L (ref 98–110)
Creat: 0.65 mg/dL (ref 0.50–1.10)
GFR, Est African American: 122 mL/min/{1.73_m2} (ref >=60)
GFR, Est Non African American: 105 mL/min/{1.73_m2} (ref >=60)
GLOBULIN: 2.2 g/dL (ref 1.9–3.7)
Glucose, Bld: 99 mg/dL (ref 65–99)
POTASSIUM: 3.9 mmol/L (ref 3.5–5.3)
SODIUM: 142 mmol/L (ref 135–146)
Total Bilirubin: 1.1 mg/dL (ref 0.2–1.2)
Total Protein: 6.8 g/dL (ref 6.1–8.1)

## 2017-10-27 LAB — CBC
HEMATOCRIT: 40.9 % (ref 35.0–45.0)
HEMOGLOBIN: 14.4 g/dL (ref 11.7–15.5)
MCH: 30 pg (ref 27.0–33.0)
MCHC: 35.2 g/dL (ref 32.0–36.0)
MCV: 85.2 fL (ref 80.0–100.0)
MPV: 11.4 fL (ref 7.5–12.5)
Platelets: 253 10*3/uL (ref 140–400)
RBC: 4.8 10*6/uL (ref 3.80–5.10)
RDW: 13 % (ref 11.0–15.0)
WBC: 7.3 10*3/uL (ref 3.8–10.8)

## 2017-10-27 LAB — HIGH SENSITIVITY CRP: hs-CRP: 2.1 mg/L

## 2017-10-27 LAB — VITAMIN B12: Vitamin B-12: 359 pg/mL (ref 200–1100)

## 2017-10-27 LAB — SEDIMENTATION RATE: SED RATE: 2 mm/h (ref 0–20)

## 2017-10-27 LAB — VITAMIN D 25 HYDROXY (VIT D DEFICIENCY, FRACTURES): Vit D, 25-Hydroxy: 22 ng/mL — ABNORMAL LOW (ref 30–100)

## 2017-10-27 LAB — TSH: TSH: 1.89 m[IU]/L

## 2017-11-09 ENCOUNTER — Telehealth: Payer: Self-pay

## 2017-11-09 DIAGNOSIS — M542 Cervicalgia: Secondary | ICD-10-CM

## 2017-11-09 NOTE — Telephone Encounter (Signed)
Fixed it - she should get a call Kelsi, I totally forgot to click the order - since I goofed can we do our best to get her scheduled for Monday (assuming insurance approves)? Or early next week wherever

## 2017-11-09 NOTE — Telephone Encounter (Signed)
Pt left a vm msg stating she is still waiting for call back to schedule an appt for MRI. Pls advise, thanks.

## 2017-11-13 NOTE — Telephone Encounter (Signed)
Imaging was authorized and imaging was notified while I was out of office last week.

## 2019-06-10 ENCOUNTER — Other Ambulatory Visit: Payer: Self-pay

## 2019-06-10 ENCOUNTER — Ambulatory Visit (INDEPENDENT_AMBULATORY_CARE_PROVIDER_SITE_OTHER): Payer: 59

## 2019-06-10 ENCOUNTER — Ambulatory Visit: Payer: 59 | Admitting: Sports Medicine

## 2019-06-10 DIAGNOSIS — M25522 Pain in left elbow: Secondary | ICD-10-CM | POA: Diagnosis not present

## 2019-06-10 MED ORDER — PREDNISONE 50 MG PO TABS: ORAL_TABLET | ORAL | 0 refills | Status: DC

## 2019-06-10 NOTE — Progress Notes (Signed)
    Procedures performed today:    None.  Independent interpretation of tests performed by another provider:   None.  Impression and Recommendations:    Left elbow pain Robin Deleon is here, she hit her elbow on an appliance approximately 4 weeks ago. Since then she has had persistent pain at a very pinpoint location just on the medial aspect of her olecranon. No bruising, no swelling, good motion, good strength. We will treat this conservatively, she likely has more of a bone contusion which can take weeks to months to resolve. X-rays, elbow sleeve, 5 days of prednisone. Return to see me in 1 month, MRI if no better.    ___________________________________________ Robin Deleon. Robin Deleon, M.D., ABFM., CAQSM. Primary Care and Sports Medicine [facility]  Adjunct Instructor of Family Medicine  [employer]

## 2019-06-10 NOTE — Assessment & Plan Note (Signed)
Robin Deleon is here, she hit her elbow on an appliance approximately 4 weeks ago. Since then she has had persistent pain at a very pinpoint location just on the medial aspect of her olecranon. No bruising, no swelling, good motion, good strength. We will treat this conservatively, she likely has more of a bone contusion which can take weeks to months to resolve. X-rays, elbow sleeve, 5 days of prednisone. Return to see me in 1 month, MRI if no better.

## 2019-07-10 ENCOUNTER — Ambulatory Visit (INDEPENDENT_AMBULATORY_CARE_PROVIDER_SITE_OTHER): Payer: 59 | Admitting: Sports Medicine

## 2019-07-10 ENCOUNTER — Encounter: Payer: Self-pay | Admitting: Sports Medicine

## 2019-07-10 DIAGNOSIS — M25522 Pain in left elbow: Secondary | ICD-10-CM

## 2019-07-10 NOTE — Assessment & Plan Note (Signed)
This pleasant 49 year old female returns, she hit her elbow on an appliance approximately 2 months ago. At the last visit we added prednisone, she did not end up taking the elbow sleeve as it irritated her olecranon. Pain was very pinpoint and just medial to the olecranon with no symptoms of ulnar neurapraxia. She continues to have pain, no improvement whatsoever. I did suspect this was a bone contusion which can take weeks to months to resolve. X-rays were negative. We have decided to give this an additional month before considering an MRI.

## 2019-07-10 NOTE — Progress Notes (Signed)
    Procedures performed today:    None.  Independent interpretation of tests performed by another provider:   None.  Impression and Recommendations:    Left elbow pain This pleasant 49 year old female returns, she hit her elbow on an appliance approximately 2 months ago. At the last visit we added prednisone, she did not end up taking the elbow sleeve as it irritated her olecranon. Pain was very pinpoint and just medial to the olecranon with no symptoms of ulnar neurapraxia. She continues to have pain, no improvement whatsoever. I did suspect this was a bone contusion which can take weeks to months to resolve. X-rays were negative. We have decided to give this an additional month before considering an MRI.    ___________________________________________ Ihor Austin. Benjamin Stain, M.D., ABFM., CAQSM. Primary Care and Sports Medicine [facility]  Adjunct Instructor of Family Medicine  [employer]

## 2019-08-07 ENCOUNTER — Ambulatory Visit (INDEPENDENT_AMBULATORY_CARE_PROVIDER_SITE_OTHER): Payer: 59 | Admitting: Sports Medicine

## 2019-08-07 DIAGNOSIS — M25522 Pain in left elbow: Secondary | ICD-10-CM | POA: Diagnosis not present

## 2019-08-07 NOTE — Progress Notes (Signed)
   Virtual Visit via Telephone   I connected with  Robin Deleon  on 08/07/19 by telephone/telehealth and verified that I am speaking with the correct person using two identifiers.   I discussed the limitations, risks, security and privacy concerns of performing an evaluation and management service by telephone, including the higher likelihood of inaccurate diagnosis and treatment, and the availability of in person appointments.  We also discussed the likely need of an additional face to face encounter for complete and high quality delivery of care.  I also discussed with the patient that there may be a patient responsible charge related to this service. The patient expressed understanding and wishes to proceed.  Provider location is either at home or medical facility. Patient location is at their home, different from provider location. People involved in care of the patient during this telehealth encounter were myself, my nurse/medical assistant, and my front office/scheduling team member.  Review of Systems: No fevers, chills, night sweats, weight loss, chest pain, or shortness of breath.   Objective Findings:    General: Speaking full sentences, no audible heavy breathing.  Sounds alert and appropriately interactive.    Independent interpretation of tests performed by another provider:   None.  Impression and Recommendations:    Left elbow pain This is a pleasant 49 year old female, approximately 3 months ago she hit her elbow on an appliance, she had pain just medial to her olecranon but with no symptoms of an ulnar neurapraxia. After several months of conservative treatment including prednisone and relative rest she has not had any improvement whatsoever. At this point we are going to proceed with MRI of the left elbow with close attention to the medial olecranon.   I discussed the above assessment and treatment plan with the patient. The patient was provided an opportunity to  ask questions and all were answered. The patient agreed with the plan and demonstrated an understanding of the instructions.   The patient was advised to call back or seek an in-person evaluation if the symptoms worsen or if the condition fails to improve as anticipated.   I provided 30 minutes of face to face and non-face-to-face time during this encounter date, time was needed to gather information, review chart, records, communicate/coordinate with staff remotely, as well as complete documentation.   ___________________________________________ Ihor Austin. Benjamin Stain, M.D., ABFM., CAQSM. Primary Care and Sports Medicine [facility]  Adjunct Professor of Family Medicine  [employer]

## 2019-08-07 NOTE — Assessment & Plan Note (Signed)
This is a pleasant 49 year old female, approximately 3 months ago she hit her elbow on an appliance, she had pain just medial to her olecranon but with no symptoms of an ulnar neurapraxia. After several months of conservative treatment including prednisone and relative rest she has not had any improvement whatsoever. At this point we are going to proceed with MRI of the left elbow with close attention to the medial olecranon.

## 2019-08-17 ENCOUNTER — Ambulatory Visit (INDEPENDENT_AMBULATORY_CARE_PROVIDER_SITE_OTHER): Payer: 59

## 2019-08-17 ENCOUNTER — Other Ambulatory Visit: Payer: Self-pay

## 2019-08-17 DIAGNOSIS — M25522 Pain in left elbow: Secondary | ICD-10-CM | POA: Diagnosis not present

## 2019-08-28 ENCOUNTER — Telehealth: Payer: Self-pay | Admitting: Osteopathic Medicine

## 2019-08-28 NOTE — Telephone Encounter (Signed)
Received note from [facility] to complete surgical clearance for this patient.  She has not been seen by me since 09/2017   --> please schedule an appointment for surgical clearance and I will complete the form at that visit.

## 2019-09-02 ENCOUNTER — Encounter: Payer: Self-pay | Admitting: Osteopathic Medicine

## 2019-09-02 ENCOUNTER — Other Ambulatory Visit: Payer: Self-pay

## 2019-09-02 ENCOUNTER — Ambulatory Visit: Payer: 59 | Admitting: Osteopathic Medicine

## 2019-09-02 VITALS — BP 118/77 | HR 69 | Temp 98.4°F | Wt 228.7 lb

## 2019-09-02 DIAGNOSIS — R5382 Chronic fatigue, unspecified: Secondary | ICD-10-CM

## 2019-09-02 DIAGNOSIS — Z0000 Encounter for general adult medical examination without abnormal findings: Secondary | ICD-10-CM | POA: Diagnosis not present

## 2019-09-02 DIAGNOSIS — G4730 Sleep apnea, unspecified: Secondary | ICD-10-CM | POA: Diagnosis not present

## 2019-09-02 DIAGNOSIS — Z87898 Personal history of other specified conditions: Secondary | ICD-10-CM | POA: Diagnosis not present

## 2019-09-02 DIAGNOSIS — Z1211 Encounter for screening for malignant neoplasm of colon: Secondary | ICD-10-CM

## 2019-09-02 NOTE — Patient Instructions (Signed)
General Preventive Care  Most recent routine screening labs: ordered .   Everyone should have blood pressure checked once per year. Goal 130/80 or less.   Tobacco: don't! Alcohol: responsible moderation is ok for most adults - if you have concerns about your alcohol intake, please talk to me!   Exercise: as tolerated to reduce risk of cardiovascular disease and diabetes. Strength training will also prevent osteoporosis.   Mental health: if need for mental health care (medicines, counseling, other), or concerns about moods, please let me know!   Sexual health: if need for STD testing, or if concerns with libido/pain problems, please let me know!   Advanced Directive: Living Will and/or Healthcare Power of Attorney recommended for all adults, regardless of age or health.  Vaccines  Flu vaccine: for almost everyone, every fall.   Shingles vaccine: after age 9.   Pneumonia vaccines: after age 67, or sooner if certain medical conditions.  Tetanus booster: every 10 years  COVID vaccine: as soon as you're eligible! Please follow [email or web address] for updates on eligibility and vaccination appointment. As of now, we do not anticipate our clinic having vaccines anytime soon.  Cancer screenings   Colon cancer screening: for everyone age 46-75. Colonoscopy available for all, many people also qualify for the Cologuard stool test   Breast cancer screening: mammogram annually after age 51.   Cervical cancer screening: Pap every 1 to 5 years depending on age and other risk factors. Can usually stop at age 50 or w/ hysterectomy.   Lung cancer screening: not needed for non-smokers  Infection screenings  . HIV: recommended screening at least once age 53-65 . Gonorrhea/Chlamydia: screening as needed . Hepatitis C: recommended once for everyone age 43-75 . TB: certain at-risk populations, or depending on work requirements and/or travel history Other . Bone Density Test: recommended for women  at age 46

## 2019-09-02 NOTE — Progress Notes (Signed)
Robin Deleon is a 48 y.o. female who presents to  [facility]  today, 09/02/19, seeking care for the following: . Physical  . Fatigue      ASSESSMENT & PLAN with other pertinent history/findings:  The primary encounter diagnosis was Annual physical exam. Diagnoses of History of prediabetes, Sleep apnea, unspecified type, Chronic fatigue, and Colon cancer screening were also pertinent to this visit.   Constitutional:  . VSS, see nurse notes . General Appearance: alert, well-developed, well-nourished, NAD Eyes: Marland Kitchen Normal lids and conjunctive, non-icteric sclera . PERRLA Ears, Nose, Mouth, Throat: . Mask in place over nose/mouth . Normal external auditory canal and TM bilaterally Neck: . No masses, trachea midline . No thyroid enlargement/tenderness/mass appreciated Respiratory: . Normal respiratory effort . No dullness/hyper-resonance to percussion . Breath sounds normal, no wheeze/rhonchi/rales Cardiovascular: . S1/S2 normal, no murmur/rub/gallop auscultated . No lower extremity edema Gastrointestinal: . Nontender, no masses . No hepatomegaly, no splenomegaly . No hernia appreciated Musculoskeletal:  . Gait normal . No clubbing/cyanosis of digits Neurological: . No cranial nerve deficit on limited exam . Motor and sensation intact and symmetric Psychiatric: . Normal judgment/insight . Normal mood and affect   Patient Instructions  General Preventive Care  Most recent routine screening labs: ordered .   Everyone should have blood pressure checked once per year. Goal 130/80 or less.   Tobacco: don't! Alcohol: responsible moderation is ok for most adults - if you have concerns about your alcohol intake, please talk to me!   Exercise: as tolerated to reduce risk of cardiovascular disease and diabetes. Strength training will also prevent osteoporosis.   Mental health: if need for mental health care  (medicines, counseling, other), or concerns about moods, please let me know!   Sexual health: if need for STD testing, or if concerns with libido/pain problems, please let me know!   Advanced Directive: Living Will and/or Healthcare Power of Attorney recommended for all adults, regardless of age or health.  Vaccines  Flu vaccine: for almost everyone, every fall.   Shingles vaccine: after age 51.   Pneumonia vaccines: after age 31, or sooner if certain medical conditions.  Tetanus booster: every 10 years  COVID vaccine: as soon as you're eligible! Please follow [email or web address] for updates on eligibility and vaccination appointment. As of now, we do not anticipate our clinic having vaccines anytime soon.  Cancer screenings   Colon cancer screening: for everyone age 34-75. Colonoscopy available for all, many people also qualify for the Cologuard stool test   Breast cancer screening: mammogram annually after age 17.   Cervical cancer screening: Pap every 1 to 5 years depending on age and other risk factors. Can usually stop at age 35 or w/ hysterectomy.   Lung cancer screening: not needed for non-smokers  Infection screenings  . HIV: recommended screening at least once age 74-65 . Gonorrhea/Chlamydia: screening as needed . Hepatitis C: recommended once for everyone age 44-75 . TB: certain at-risk populations, or depending on work requirements and/or travel history Other . Bone Density Test: recommended for women at age 32    Orders Placed This Encounter  Procedures  . CBC  . COMPLETE METABOLIC PANEL WITH GFR  . Lipid panel  . Hemoglobin A1c    No orders of the defined types were placed in this encounter.      Follow-up instructions: Return in about 1 year (around 09/01/2020) for [facility] (call week prior to visit for lab orders).  BP 118/77 (BP Location: Left Arm, Patient Position: Sitting,  Cuff Size: Large)   Pulse 69   Temp 98.4 F (36.9 C) (Oral)   Wt 228 lb 11.2 oz (103.7 kg)   BMI 33.77 kg/m   Current Meds  Medication Sig  . cetirizine (ZYRTEC) 10 MG chewable tablet Chew 10 mg by mouth 2 (two) times daily.  Marland Kitchen desvenlafaxine (PRISTIQ) 50 MG 24 hr tablet Pristiq 50 mg tablet,extended release  . ibuprofen (ADVIL,MOTRIN) 800 MG tablet Take 1 tablet (800 mg total) by mouth every 8 (eight) hours as needed. For pain    No results found for this or any previous visit (from the past 72 hour(s)).  No results found.  Depression screen [facility] 2/9 09/07/2016  Decreased Interest 0  Down, Depressed, Hopeless 0  PHQ - 2 Score 0    No flowsheet data found.    All questions at time of visit were answered - patient instructed to contact office with any additional concerns or updates.  ER/RTC precautions were reviewed with the patient.  Please note: voice recognition software was used to produce this document, and typos may escape review. Please contact Dr. Lyn Hollingshead for any needed clarifications.

## 2019-09-03 LAB — CBC
HCT: 42.8 % (ref 35.0–45.0)
Hemoglobin: 14.6 g/dL (ref 11.7–15.5)
MCH: 30.2 pg (ref 27.0–33.0)
MCHC: 34.1 g/dL (ref 32.0–36.0)
MCV: 88.6 fL (ref 80.0–100.0)
MPV: 11.2 fL (ref 7.5–12.5)
Platelets: 211 10*3/uL (ref 140–400)
RBC: 4.83 10*6/uL (ref 3.80–5.10)
RDW: 12.4 % (ref 11.0–15.0)
WBC: 6.3 10*3/uL (ref 3.8–10.8)

## 2019-09-03 LAB — COMPLETE METABOLIC PANEL WITH GFR
AG Ratio: 2.1 (calc) (ref 1.0–2.5)
ALT: 19 U/L (ref 6–29)
AST: 24 U/L (ref 10–35)
Albumin: 4.2 g/dL (ref 3.6–5.1)
Alkaline phosphatase (APISO): 60 U/L (ref 37–153)
BUN: 14 mg/dL (ref 7–25)
CO2: 27 mmol/L (ref 20–32)
Calcium: 9 mg/dL (ref 8.6–10.4)
Chloride: 107 mmol/L (ref 98–110)
Creat: 0.61 mg/dL (ref 0.50–1.05)
GFR, Est African American: 123 mL/min/{1.73_m2} (ref >=60)
GFR, Est Non African American: 106 mL/min/{1.73_m2} (ref >=60)
Globulin: 2 g/dL (calc) (ref 1.9–3.7)
Glucose, Bld: 129 mg/dL — ABNORMAL HIGH (ref 65–99)
Potassium: 4 mmol/L (ref 3.5–5.3)
Sodium: 140 mmol/L (ref 135–146)
Total Bilirubin: 1.1 mg/dL (ref 0.2–1.2)
Total Protein: 6.2 g/dL (ref 6.1–8.1)

## 2019-09-03 LAB — LIPID PANEL
Cholesterol: 223 mg/dL — ABNORMAL HIGH (ref <200)
HDL: 39 mg/dL — ABNORMAL LOW (ref >=50)
LDL Cholesterol (Calc): 159 mg/dL (calc) — ABNORMAL HIGH
Non-HDL Cholesterol (Calc): 184 mg/dL (calc) — ABNORMAL HIGH (ref <130)
Total CHOL/HDL Ratio: 5.7 (calc) — ABNORMAL HIGH (ref <5.0)
Triglycerides: 128 mg/dL (ref <150)

## 2019-09-03 LAB — HEMOGLOBIN A1C
Hgb A1c MFr Bld: 6 % of total Hgb — ABNORMAL HIGH (ref <5.7)
Mean Plasma Glucose: 126 (calc)
eAG (mmol/L): 7 (calc)

## 2019-09-03 LAB — TSH: TSH: 3.21 mIU/L

## 2022-03-15 IMAGING — MR MR ELBOW*L* W/O CM
7 series · 36 of 40 positions shown · non-contrast
Comparison: Radiographs dated 06/10/2019

CLINICAL DATA: Persistent posteromedial elbow pain after trauma 3
months ago.

EXAM:
MRI OF THE LEFT ELBOW WITHOUT CONTRAST
TECHNIQUE: Multiplanar, multisequence MR imaging of the elbow was performed. No
intravenous contrast was administered.

[Series 5: T1 · sagittal · 4.0mm · 0.50mm/px · 4 of 24 slices shown (1 of 3)]
[im 1/24]
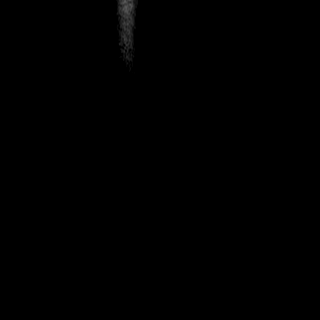
[im 8/24]
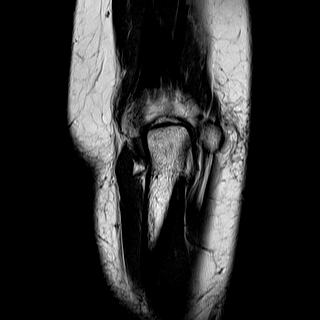
[im 16/24]
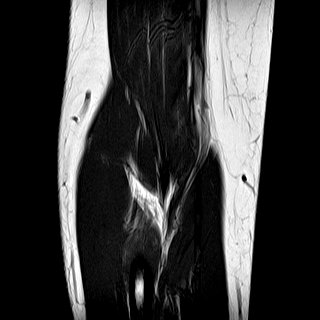
[im 24/24]
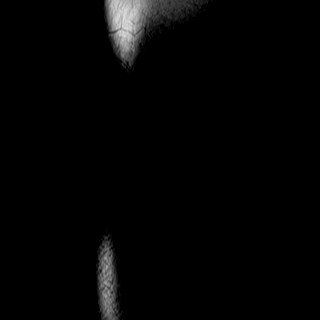

[Series 6: T2 fat-sat · sagittal · 4.0mm · 0.55mm/px · 5 of 24 slices shown (1 of 2)]
[im 1/24]
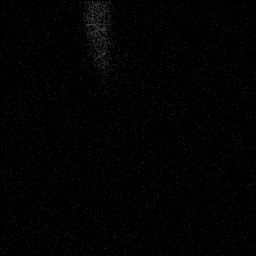
[im 6/24]
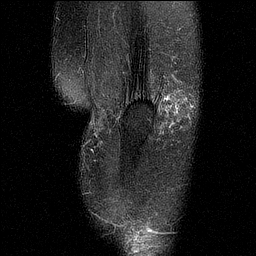
[im 12/24]
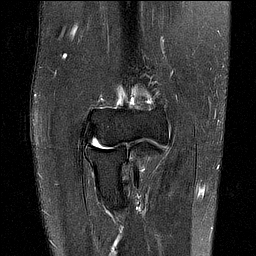
[im 18/24]
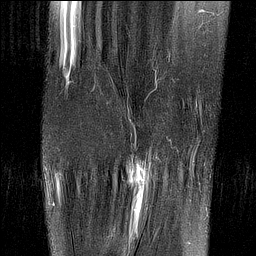
[im 24/24]
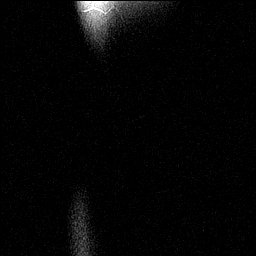

[Series 7: PD fat-sat · coronal · 3.0mm · 0.31mm/px · 7 of 33 slices shown]
[im 1/33]
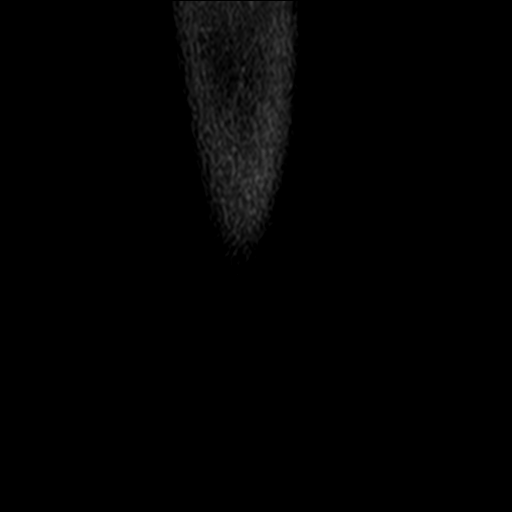
[im 6/33]
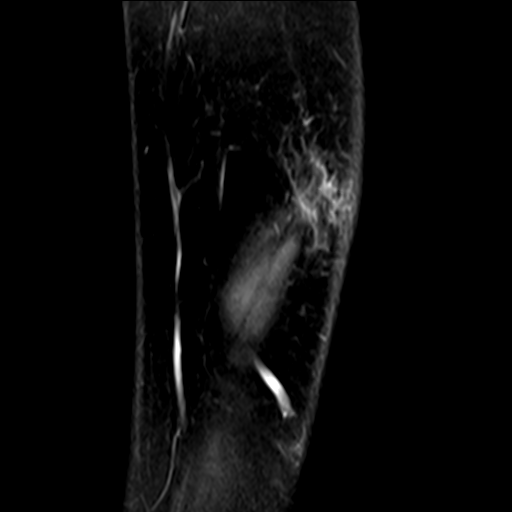
[im 11/33]
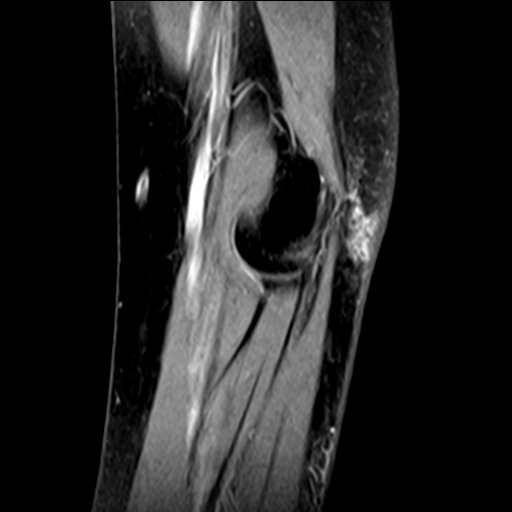
[im 17/33]
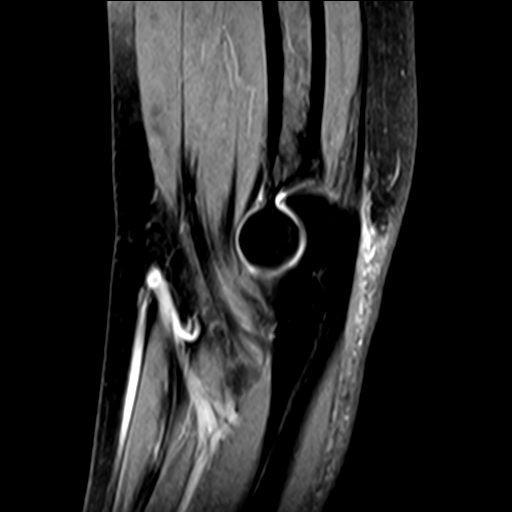
[im 22/33]
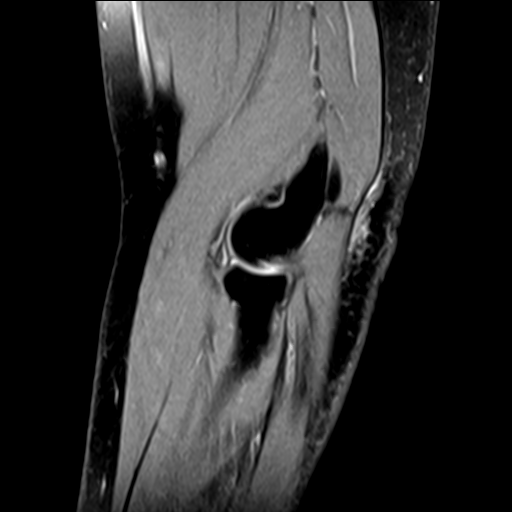
[im 27/33]
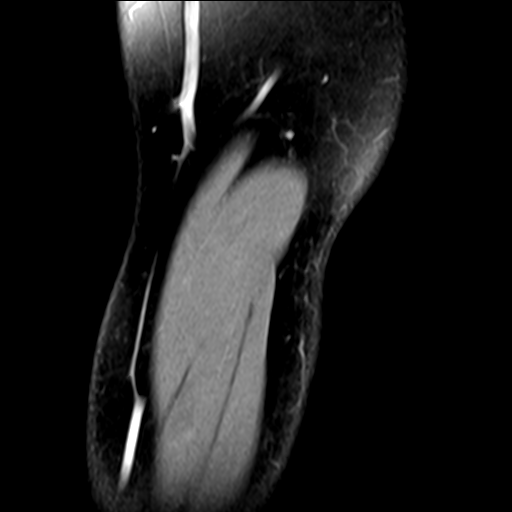
[im 33/33]
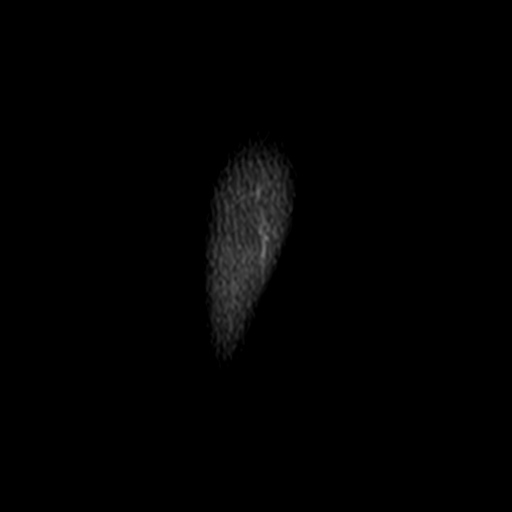

[Series 8: STIR · coronal · 2.0mm · 0.31mm/px · 1 of 22 slices shown]
[im 1/22]
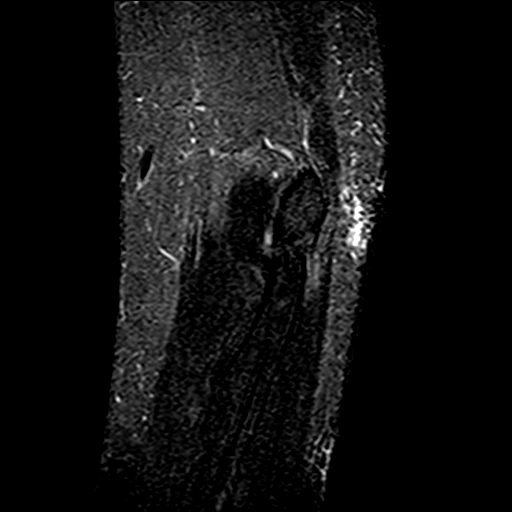

[Series 9: T1 · coronal · 2.0mm · 0.42mm/px · 5 of 22 slices shown (2 of 3)]
[im 1/22]
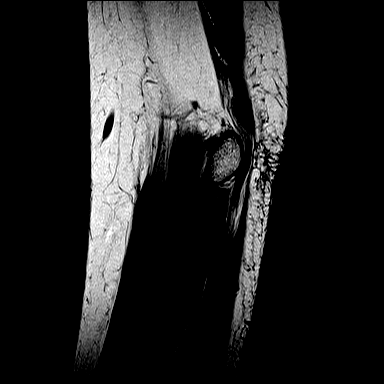
[im 6/22]
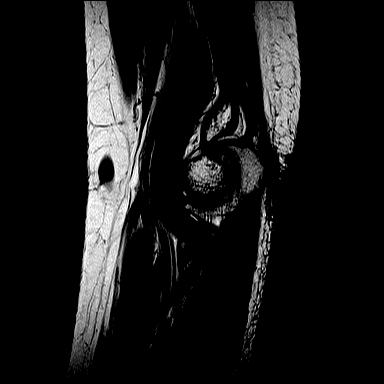
[im 11/22]
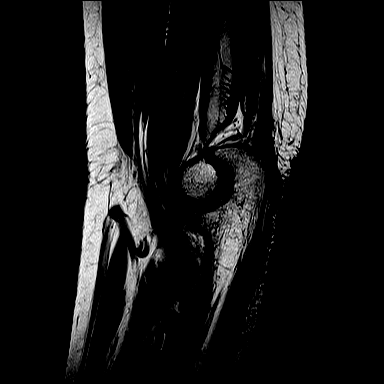
[im 16/22]
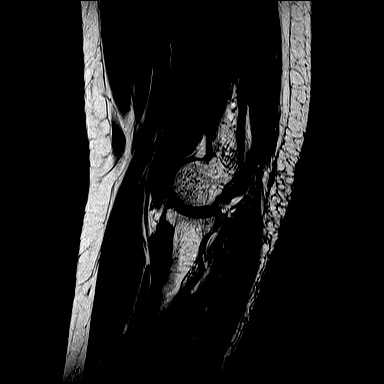
[im 22/22]
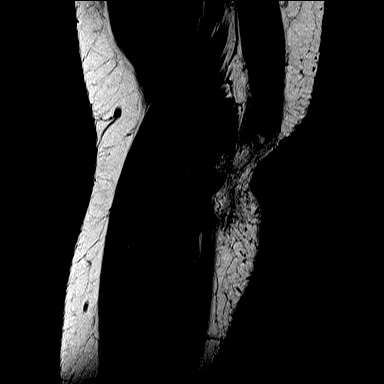

[Series 100: T1 · axial · 4.0mm · 0.44mm/px · z∈[-83,+67]mm · 7 of 35 slices shown (3 of 3)]
[im 1/35]
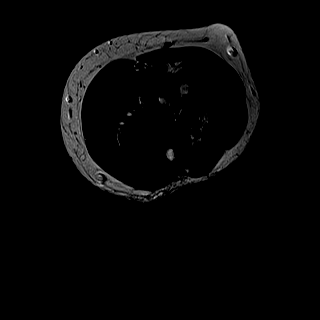
[im 6/35]
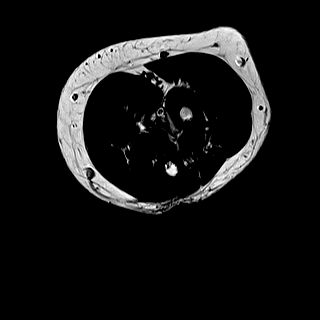
[im 12/35]
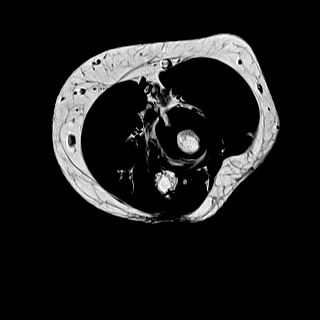
[im 18/35]
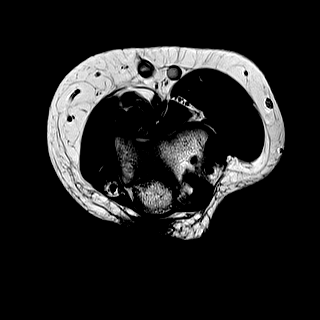
[im 23/35]
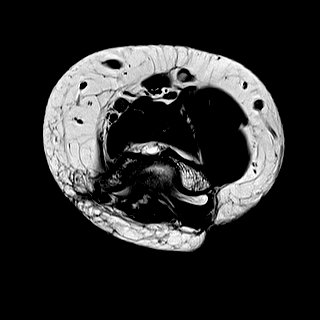
[im 29/35]
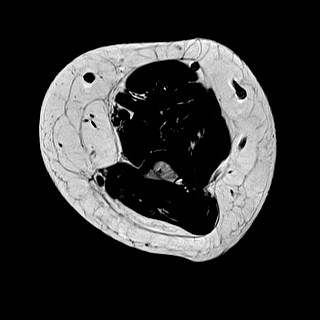
[im 35/35]
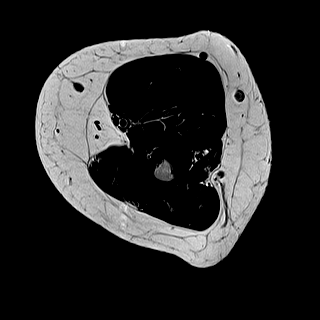

[Series 101: T2 fat-sat · axial · 4.0mm · 0.55mm/px · z∈[-83,+67]mm · 7 of 35 slices shown (2 of 2)]
[im 1/35]
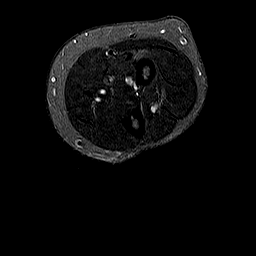
[im 6/35]
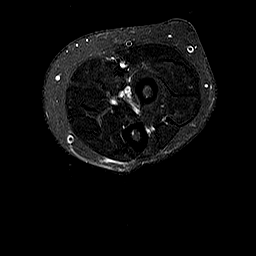
[im 12/35]
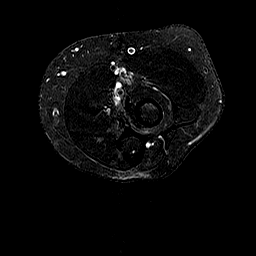
[im 18/35]
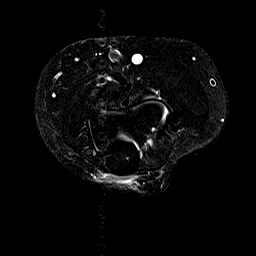
[im 23/35]
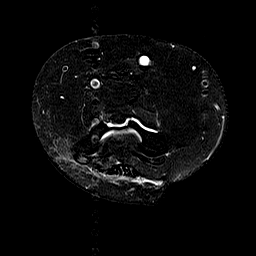
[im 29/35]
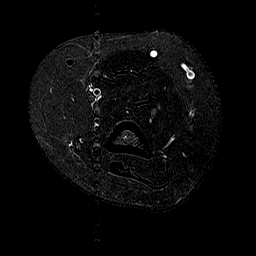
[im 35/35]
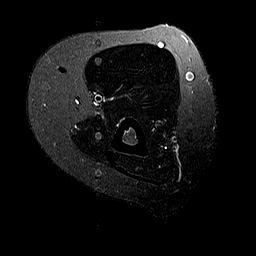

[36 of 40 positions shown; findings below may reference images not displayed]

FINDINGS: TENDONS

Common forearm flexor origin: Normal.

Common forearm extensor origin: Normal.

Biceps: Normal.

Triceps: Normal.

LIGAMENTS

Medial stabilizers: Normal.

Lateral stabilizers:  Normal.

Cartilage: Normal.

Joint: Trace joint effusion. No loose bodies.

Cubital tunnel: There is a small focal area of edema in the ulnar
nerve just proximal to the medial epicondyle, best seen on image 12
of series 101 and image 8 of series 6. The nerve proximal and distal
to this short-segment has normal signal intensity. There is adjacent
edema or fibrosis in the overlying subcutaneous fat which may
represent the sequela of the reported previous contusion.

Bones: 3 mm cyst at the lateral aspect of the trochlea of the distal
humerus. Otherwise normal.
IMPRESSION: 1. Focal edema in the ulnar nerve just proximal to the medial
epicondyle with overlying soft tissue edema or fibrosis consistent
with the patient's history of previous contusion.
2. Trace elbow joint effusion.
3. Otherwise, normal exam.
